# Patient Record
Sex: Male | Born: 1965
Health system: Southern US, Community
[De-identification: ages and names within clinical notes are randomized; demographics above are authoritative.]

## PROBLEM LIST (undated history)

## (undated) DIAGNOSIS — F32A Depression, unspecified: Secondary | ICD-10-CM

## (undated) DIAGNOSIS — D1803 Hemangioma of intra-abdominal structures: Secondary | ICD-10-CM

## (undated) DIAGNOSIS — M1990 Unspecified osteoarthritis, unspecified site: Secondary | ICD-10-CM

## (undated) DIAGNOSIS — E2749 Other adrenocortical insufficiency: Secondary | ICD-10-CM

## (undated) DIAGNOSIS — E230 Hypopituitarism: Secondary | ICD-10-CM

## (undated) DIAGNOSIS — R079 Chest pain, unspecified: Secondary | ICD-10-CM

## (undated) HISTORY — DX: Unspecified osteoarthritis, unspecified site: M19.90

## (undated) HISTORY — DX: Hypopituitarism: E23.0

## (undated) HISTORY — PX: VASECTOMY: SHX75

## (undated) HISTORY — DX: Chest pain, unspecified: R07.9

## (undated) HISTORY — PX: SHOULDER SURGERY: SHX246

## (undated) HISTORY — DX: Other adrenocortical insufficiency: E27.49

## (undated) HISTORY — DX: Depression, unspecified: F32.A

## (undated) HISTORY — DX: Hemangioma of intra-abdominal structures: D18.03

---

## 1989-03-30 HISTORY — PX: WRIST SURGERY: SHX841

## 2000-05-20 ENCOUNTER — Encounter: Payer: Self-pay | Admitting: Family Medicine

## 2000-05-20 ENCOUNTER — Ambulatory Visit (HOSPITAL_COMMUNITY): Admission: RE | Admit: 2000-05-20 | Discharge: 2000-05-20 | Payer: Self-pay | Admitting: Family Medicine

## 2000-08-10 ENCOUNTER — Encounter: Payer: Self-pay | Admitting: Family Medicine

## 2000-08-10 ENCOUNTER — Ambulatory Visit (HOSPITAL_COMMUNITY): Admission: RE | Admit: 2000-08-10 | Discharge: 2000-08-10 | Payer: Self-pay | Admitting: Family Medicine

## 2001-04-06 ENCOUNTER — Ambulatory Visit (HOSPITAL_COMMUNITY): Admission: RE | Admit: 2001-04-06 | Discharge: 2001-04-06 | Payer: Self-pay | Admitting: Family Medicine

## 2001-04-06 ENCOUNTER — Encounter: Payer: Self-pay | Admitting: Family Medicine

## 2012-12-19 ENCOUNTER — Other Ambulatory Visit: Payer: Self-pay | Admitting: Family Medicine

## 2012-12-19 DIAGNOSIS — R109 Unspecified abdominal pain: Secondary | ICD-10-CM

## 2012-12-20 ENCOUNTER — Ambulatory Visit
Admission: RE | Admit: 2012-12-20 | Discharge: 2012-12-20 | Disposition: A | Discharge status: Home / Self Care | Payer: BC Managed Care – PPO | Source: Ambulatory Visit | Attending: Family Medicine | Admitting: Family Medicine

## 2012-12-20 DIAGNOSIS — R109 Unspecified abdominal pain: Secondary | ICD-10-CM

## 2012-12-20 MED ORDER — IOHEXOL 300 MG/ML  SOLN
100.0000 mL | Freq: Once | INTRAMUSCULAR | Status: AC | PRN
  Administered 2012-12-20: 100 mL via INTRAVENOUS

## 2012-12-21 ENCOUNTER — Other Ambulatory Visit: Payer: Self-pay | Admitting: Family Medicine

## 2012-12-21 DIAGNOSIS — K769 Liver disease, unspecified: Secondary | ICD-10-CM

## 2012-12-28 ENCOUNTER — Ambulatory Visit
Admission: RE | Admit: 2012-12-28 | Discharge: 2012-12-28 | Disposition: A | Discharge status: Home / Self Care | Payer: BC Managed Care – PPO | Source: Ambulatory Visit | Attending: Family Medicine | Admitting: Family Medicine

## 2012-12-28 DIAGNOSIS — K769 Liver disease, unspecified: Secondary | ICD-10-CM

## 2012-12-28 MED ORDER — GADOXETATE DISODIUM 0.25 MMOL/ML IV SOLN
9.0000 mL | Freq: Once | INTRAVENOUS | Status: AC | PRN
  Administered 2012-12-28: 9 mL via INTRAVENOUS

## 2016-09-25 DIAGNOSIS — F900 Attention-deficit hyperactivity disorder, predominantly inattentive type: Secondary | ICD-10-CM | POA: Diagnosis not present

## 2017-10-15 DIAGNOSIS — M25851 Other specified joint disorders, right hip: Secondary | ICD-10-CM | POA: Diagnosis not present

## 2017-10-15 DIAGNOSIS — M25551 Pain in right hip: Secondary | ICD-10-CM | POA: Diagnosis not present

## 2017-10-20 DIAGNOSIS — M25551 Pain in right hip: Secondary | ICD-10-CM | POA: Diagnosis not present

## 2017-10-20 DIAGNOSIS — M25552 Pain in left hip: Secondary | ICD-10-CM | POA: Diagnosis not present

## 2018-03-30 HISTORY — PX: JOINT REPLACEMENT: SHX530

## 2018-04-07 DIAGNOSIS — M160 Bilateral primary osteoarthritis of hip: Secondary | ICD-10-CM | POA: Diagnosis not present

## 2018-04-07 DIAGNOSIS — M9903 Segmental and somatic dysfunction of lumbar region: Secondary | ICD-10-CM | POA: Diagnosis not present

## 2018-04-07 DIAGNOSIS — M9901 Segmental and somatic dysfunction of cervical region: Secondary | ICD-10-CM | POA: Diagnosis not present

## 2018-04-07 DIAGNOSIS — M9905 Segmental and somatic dysfunction of pelvic region: Secondary | ICD-10-CM | POA: Diagnosis not present

## 2018-04-15 ENCOUNTER — Other Ambulatory Visit: Payer: Self-pay | Admitting: Chiropractic Medicine

## 2018-04-15 DIAGNOSIS — M25551 Pain in right hip: Secondary | ICD-10-CM

## 2018-04-24 ENCOUNTER — Ambulatory Visit
Admission: RE | Admit: 2018-04-24 | Discharge: 2018-04-24 | Disposition: A | Discharge status: Home / Self Care | Payer: BLUE CROSS/BLUE SHIELD | Source: Ambulatory Visit | Attending: Chiropractic Medicine | Admitting: Chiropractic Medicine

## 2018-04-24 DIAGNOSIS — M1611 Unilateral primary osteoarthritis, right hip: Secondary | ICD-10-CM | POA: Diagnosis not present

## 2018-04-24 DIAGNOSIS — M25551 Pain in right hip: Secondary | ICD-10-CM

## 2018-04-24 MED ORDER — GADOBENATE DIMEGLUMINE 529 MG/ML IV SOLN
19.0000 mL | Freq: Once | INTRAVENOUS | Status: AC | PRN
  Administered 2018-04-24: 19 mL via INTRAVENOUS

## 2018-05-04 DIAGNOSIS — M160 Bilateral primary osteoarthritis of hip: Secondary | ICD-10-CM | POA: Diagnosis not present

## 2018-05-04 DIAGNOSIS — M9905 Segmental and somatic dysfunction of pelvic region: Secondary | ICD-10-CM | POA: Diagnosis not present

## 2018-05-04 DIAGNOSIS — M9903 Segmental and somatic dysfunction of lumbar region: Secondary | ICD-10-CM | POA: Diagnosis not present

## 2018-05-04 DIAGNOSIS — M9901 Segmental and somatic dysfunction of cervical region: Secondary | ICD-10-CM | POA: Diagnosis not present

## 2018-06-01 DIAGNOSIS — M9903 Segmental and somatic dysfunction of lumbar region: Secondary | ICD-10-CM | POA: Diagnosis not present

## 2018-06-01 DIAGNOSIS — M9901 Segmental and somatic dysfunction of cervical region: Secondary | ICD-10-CM | POA: Diagnosis not present

## 2018-06-01 DIAGNOSIS — M160 Bilateral primary osteoarthritis of hip: Secondary | ICD-10-CM | POA: Diagnosis not present

## 2018-06-01 DIAGNOSIS — M9905 Segmental and somatic dysfunction of pelvic region: Secondary | ICD-10-CM | POA: Diagnosis not present

## 2018-06-07 DIAGNOSIS — M1611 Unilateral primary osteoarthritis, right hip: Secondary | ICD-10-CM | POA: Diagnosis not present

## 2018-06-07 DIAGNOSIS — M25551 Pain in right hip: Secondary | ICD-10-CM | POA: Diagnosis not present

## 2018-07-14 DIAGNOSIS — M160 Bilateral primary osteoarthritis of hip: Secondary | ICD-10-CM | POA: Diagnosis not present

## 2018-08-19 DIAGNOSIS — Z01818 Encounter for other preprocedural examination: Secondary | ICD-10-CM | POA: Diagnosis not present

## 2018-08-19 DIAGNOSIS — F900 Attention-deficit hyperactivity disorder, predominantly inattentive type: Secondary | ICD-10-CM | POA: Diagnosis not present

## 2018-08-19 DIAGNOSIS — Z8342 Family history of familial hypercholesterolemia: Secondary | ICD-10-CM | POA: Diagnosis not present

## 2018-08-19 DIAGNOSIS — M1611 Unilateral primary osteoarthritis, right hip: Secondary | ICD-10-CM | POA: Diagnosis not present

## 2018-08-19 DIAGNOSIS — Z8249 Family history of ischemic heart disease and other diseases of the circulatory system: Secondary | ICD-10-CM | POA: Diagnosis not present

## 2018-08-19 DIAGNOSIS — Z131 Encounter for screening for diabetes mellitus: Secondary | ICD-10-CM | POA: Diagnosis not present

## 2018-08-19 DIAGNOSIS — Z125 Encounter for screening for malignant neoplasm of prostate: Secondary | ICD-10-CM | POA: Diagnosis not present

## 2018-08-19 DIAGNOSIS — Z13220 Encounter for screening for lipoid disorders: Secondary | ICD-10-CM | POA: Diagnosis not present

## 2018-08-31 DIAGNOSIS — M9903 Segmental and somatic dysfunction of lumbar region: Secondary | ICD-10-CM | POA: Diagnosis not present

## 2018-08-31 DIAGNOSIS — M9905 Segmental and somatic dysfunction of pelvic region: Secondary | ICD-10-CM | POA: Diagnosis not present

## 2018-08-31 DIAGNOSIS — M160 Bilateral primary osteoarthritis of hip: Secondary | ICD-10-CM | POA: Diagnosis not present

## 2018-08-31 DIAGNOSIS — M9901 Segmental and somatic dysfunction of cervical region: Secondary | ICD-10-CM | POA: Diagnosis not present

## 2018-09-28 DIAGNOSIS — Z0189 Encounter for other specified special examinations: Secondary | ICD-10-CM | POA: Diagnosis not present

## 2018-10-04 DIAGNOSIS — M1611 Unilateral primary osteoarthritis, right hip: Secondary | ICD-10-CM | POA: Diagnosis not present

## 2018-11-08 DIAGNOSIS — Z471 Aftercare following joint replacement surgery: Secondary | ICD-10-CM | POA: Diagnosis not present

## 2018-11-08 DIAGNOSIS — Z96641 Presence of right artificial hip joint: Secondary | ICD-10-CM | POA: Diagnosis not present

## 2018-11-08 DIAGNOSIS — M25552 Pain in left hip: Secondary | ICD-10-CM | POA: Diagnosis not present

## 2018-11-16 DIAGNOSIS — R05 Cough: Secondary | ICD-10-CM | POA: Diagnosis not present

## 2018-12-02 DIAGNOSIS — M1612 Unilateral primary osteoarthritis, left hip: Secondary | ICD-10-CM | POA: Diagnosis not present

## 2018-12-02 DIAGNOSIS — Z9889 Other specified postprocedural states: Secondary | ICD-10-CM | POA: Diagnosis not present

## 2018-12-02 DIAGNOSIS — Z4732 Aftercare following explantation of hip joint prosthesis: Secondary | ICD-10-CM | POA: Diagnosis not present

## 2018-12-02 DIAGNOSIS — Z96641 Presence of right artificial hip joint: Secondary | ICD-10-CM | POA: Diagnosis not present

## 2018-12-12 DIAGNOSIS — M1612 Unilateral primary osteoarthritis, left hip: Secondary | ICD-10-CM | POA: Diagnosis not present

## 2018-12-12 DIAGNOSIS — Z9889 Other specified postprocedural states: Secondary | ICD-10-CM | POA: Diagnosis not present

## 2018-12-12 DIAGNOSIS — Z4732 Aftercare following explantation of hip joint prosthesis: Secondary | ICD-10-CM | POA: Diagnosis not present

## 2018-12-12 DIAGNOSIS — Z96641 Presence of right artificial hip joint: Secondary | ICD-10-CM | POA: Diagnosis not present

## 2018-12-19 DIAGNOSIS — Z4732 Aftercare following explantation of hip joint prosthesis: Secondary | ICD-10-CM | POA: Diagnosis not present

## 2018-12-19 DIAGNOSIS — M1612 Unilateral primary osteoarthritis, left hip: Secondary | ICD-10-CM | POA: Diagnosis not present

## 2018-12-19 DIAGNOSIS — Z96641 Presence of right artificial hip joint: Secondary | ICD-10-CM | POA: Diagnosis not present

## 2018-12-19 DIAGNOSIS — Z9889 Other specified postprocedural states: Secondary | ICD-10-CM | POA: Diagnosis not present

## 2018-12-26 DIAGNOSIS — Z4732 Aftercare following explantation of hip joint prosthesis: Secondary | ICD-10-CM | POA: Diagnosis not present

## 2018-12-26 DIAGNOSIS — Z9889 Other specified postprocedural states: Secondary | ICD-10-CM | POA: Diagnosis not present

## 2018-12-26 DIAGNOSIS — M1612 Unilateral primary osteoarthritis, left hip: Secondary | ICD-10-CM | POA: Diagnosis not present

## 2018-12-26 DIAGNOSIS — Z96641 Presence of right artificial hip joint: Secondary | ICD-10-CM | POA: Diagnosis not present

## 2019-01-05 ENCOUNTER — Other Ambulatory Visit: Payer: Self-pay | Admitting: Chiropractic Medicine

## 2019-01-05 DIAGNOSIS — M25559 Pain in unspecified hip: Secondary | ICD-10-CM

## 2019-01-09 DIAGNOSIS — N529 Male erectile dysfunction, unspecified: Secondary | ICD-10-CM | POA: Diagnosis not present

## 2019-01-09 DIAGNOSIS — Z4732 Aftercare following explantation of hip joint prosthesis: Secondary | ICD-10-CM | POA: Diagnosis not present

## 2019-01-09 DIAGNOSIS — Z23 Encounter for immunization: Secondary | ICD-10-CM | POA: Diagnosis not present

## 2019-01-09 DIAGNOSIS — Z0000 Encounter for general adult medical examination without abnormal findings: Secondary | ICD-10-CM | POA: Diagnosis not present

## 2019-01-09 DIAGNOSIS — Z96641 Presence of right artificial hip joint: Secondary | ICD-10-CM | POA: Diagnosis not present

## 2019-01-09 DIAGNOSIS — M1612 Unilateral primary osteoarthritis, left hip: Secondary | ICD-10-CM | POA: Diagnosis not present

## 2019-01-09 DIAGNOSIS — Z9889 Other specified postprocedural states: Secondary | ICD-10-CM | POA: Diagnosis not present

## 2019-01-16 DIAGNOSIS — Z9889 Other specified postprocedural states: Secondary | ICD-10-CM | POA: Diagnosis not present

## 2019-01-16 DIAGNOSIS — Z4732 Aftercare following explantation of hip joint prosthesis: Secondary | ICD-10-CM | POA: Diagnosis not present

## 2019-01-16 DIAGNOSIS — M1612 Unilateral primary osteoarthritis, left hip: Secondary | ICD-10-CM | POA: Diagnosis not present

## 2019-01-16 DIAGNOSIS — Z96641 Presence of right artificial hip joint: Secondary | ICD-10-CM | POA: Diagnosis not present

## 2019-01-22 ENCOUNTER — Other Ambulatory Visit: Payer: Self-pay

## 2019-01-22 ENCOUNTER — Ambulatory Visit
Admission: RE | Admit: 2019-01-22 | Discharge: 2019-01-22 | Disposition: A | Discharge status: Home / Self Care | Payer: BLUE CROSS/BLUE SHIELD | Source: Ambulatory Visit | Attending: Chiropractic Medicine | Admitting: Chiropractic Medicine

## 2019-01-22 DIAGNOSIS — M1612 Unilateral primary osteoarthritis, left hip: Secondary | ICD-10-CM | POA: Diagnosis not present

## 2019-01-22 DIAGNOSIS — M25559 Pain in unspecified hip: Secondary | ICD-10-CM

## 2019-01-22 DIAGNOSIS — Z96641 Presence of right artificial hip joint: Secondary | ICD-10-CM | POA: Diagnosis not present

## 2019-01-22 DIAGNOSIS — M24152 Other articular cartilage disorders, left hip: Secondary | ICD-10-CM | POA: Diagnosis not present

## 2019-01-22 DIAGNOSIS — M24852 Other specific joint derangements of left hip, not elsewhere classified: Secondary | ICD-10-CM | POA: Diagnosis not present

## 2019-01-22 MED ORDER — GADOBENATE DIMEGLUMINE 529 MG/ML IV SOLN
19.0000 mL | Freq: Once | INTRAVENOUS | Status: AC | PRN
  Administered 2019-01-22: 15:00:00 19 mL via INTRAVENOUS

## 2019-01-23 DIAGNOSIS — Z9889 Other specified postprocedural states: Secondary | ICD-10-CM | POA: Diagnosis not present

## 2019-01-23 DIAGNOSIS — M1612 Unilateral primary osteoarthritis, left hip: Secondary | ICD-10-CM | POA: Diagnosis not present

## 2019-01-23 DIAGNOSIS — Z96641 Presence of right artificial hip joint: Secondary | ICD-10-CM | POA: Diagnosis not present

## 2019-01-23 DIAGNOSIS — Z4732 Aftercare following explantation of hip joint prosthesis: Secondary | ICD-10-CM | POA: Diagnosis not present

## 2019-01-25 DIAGNOSIS — M1612 Unilateral primary osteoarthritis, left hip: Secondary | ICD-10-CM | POA: Diagnosis not present

## 2019-01-25 DIAGNOSIS — M25552 Pain in left hip: Secondary | ICD-10-CM | POA: Diagnosis not present

## 2019-02-01 DIAGNOSIS — M1612 Unilateral primary osteoarthritis, left hip: Secondary | ICD-10-CM | POA: Diagnosis not present

## 2019-02-01 DIAGNOSIS — Z4732 Aftercare following explantation of hip joint prosthesis: Secondary | ICD-10-CM | POA: Diagnosis not present

## 2019-02-01 DIAGNOSIS — Z96641 Presence of right artificial hip joint: Secondary | ICD-10-CM | POA: Diagnosis not present

## 2019-02-01 DIAGNOSIS — Z9889 Other specified postprocedural states: Secondary | ICD-10-CM | POA: Diagnosis not present

## 2019-02-06 DIAGNOSIS — Z9889 Other specified postprocedural states: Secondary | ICD-10-CM | POA: Diagnosis not present

## 2019-02-06 DIAGNOSIS — M1612 Unilateral primary osteoarthritis, left hip: Secondary | ICD-10-CM | POA: Diagnosis not present

## 2019-02-06 DIAGNOSIS — Z4732 Aftercare following explantation of hip joint prosthesis: Secondary | ICD-10-CM | POA: Diagnosis not present

## 2019-02-06 DIAGNOSIS — Z96641 Presence of right artificial hip joint: Secondary | ICD-10-CM | POA: Diagnosis not present

## 2019-02-07 DIAGNOSIS — Z01818 Encounter for other preprocedural examination: Secondary | ICD-10-CM | POA: Diagnosis not present

## 2019-02-13 DIAGNOSIS — Z96641 Presence of right artificial hip joint: Secondary | ICD-10-CM | POA: Diagnosis not present

## 2019-02-13 DIAGNOSIS — Z9889 Other specified postprocedural states: Secondary | ICD-10-CM | POA: Diagnosis not present

## 2019-02-13 DIAGNOSIS — Z4732 Aftercare following explantation of hip joint prosthesis: Secondary | ICD-10-CM | POA: Diagnosis not present

## 2019-02-13 DIAGNOSIS — M1612 Unilateral primary osteoarthritis, left hip: Secondary | ICD-10-CM | POA: Diagnosis not present

## 2019-02-14 DIAGNOSIS — M1612 Unilateral primary osteoarthritis, left hip: Secondary | ICD-10-CM | POA: Diagnosis not present

## 2019-03-13 DIAGNOSIS — Z4732 Aftercare following explantation of hip joint prosthesis: Secondary | ICD-10-CM | POA: Diagnosis not present

## 2019-03-13 DIAGNOSIS — Z96643 Presence of artificial hip joint, bilateral: Secondary | ICD-10-CM | POA: Diagnosis not present

## 2019-03-13 DIAGNOSIS — M1612 Unilateral primary osteoarthritis, left hip: Secondary | ICD-10-CM | POA: Diagnosis not present

## 2019-03-13 DIAGNOSIS — Z9889 Other specified postprocedural states: Secondary | ICD-10-CM | POA: Diagnosis not present

## 2019-03-14 DIAGNOSIS — Z23 Encounter for immunization: Secondary | ICD-10-CM | POA: Diagnosis not present

## 2019-03-20 DIAGNOSIS — Z96643 Presence of artificial hip joint, bilateral: Secondary | ICD-10-CM | POA: Diagnosis not present

## 2019-03-20 DIAGNOSIS — M1612 Unilateral primary osteoarthritis, left hip: Secondary | ICD-10-CM | POA: Diagnosis not present

## 2019-03-20 DIAGNOSIS — Z4732 Aftercare following explantation of hip joint prosthesis: Secondary | ICD-10-CM | POA: Diagnosis not present

## 2019-03-20 DIAGNOSIS — Z9889 Other specified postprocedural states: Secondary | ICD-10-CM | POA: Diagnosis not present

## 2019-03-21 DIAGNOSIS — Z96642 Presence of left artificial hip joint: Secondary | ICD-10-CM | POA: Diagnosis not present

## 2019-03-30 DIAGNOSIS — M1612 Unilateral primary osteoarthritis, left hip: Secondary | ICD-10-CM | POA: Diagnosis not present

## 2019-03-30 DIAGNOSIS — Z9889 Other specified postprocedural states: Secondary | ICD-10-CM | POA: Diagnosis not present

## 2019-03-30 DIAGNOSIS — Z4732 Aftercare following explantation of hip joint prosthesis: Secondary | ICD-10-CM | POA: Diagnosis not present

## 2019-03-30 DIAGNOSIS — Z96643 Presence of artificial hip joint, bilateral: Secondary | ICD-10-CM | POA: Diagnosis not present

## 2019-10-11 DIAGNOSIS — B349 Viral infection, unspecified: Secondary | ICD-10-CM | POA: Diagnosis not present

## 2019-10-11 DIAGNOSIS — B029 Zoster without complications: Secondary | ICD-10-CM | POA: Diagnosis not present

## 2019-10-30 DIAGNOSIS — R519 Headache, unspecified: Secondary | ICD-10-CM | POA: Diagnosis not present

## 2019-10-30 DIAGNOSIS — W57XXXA Bitten or stung by nonvenomous insect and other nonvenomous arthropods, initial encounter: Secondary | ICD-10-CM | POA: Diagnosis not present

## 2019-10-30 DIAGNOSIS — R5383 Other fatigue: Secondary | ICD-10-CM | POA: Diagnosis not present

## 2019-10-30 DIAGNOSIS — M791 Myalgia, unspecified site: Secondary | ICD-10-CM | POA: Diagnosis not present

## 2019-11-08 DIAGNOSIS — R5383 Other fatigue: Secondary | ICD-10-CM | POA: Diagnosis not present

## 2019-11-08 DIAGNOSIS — Z03818 Encounter for observation for suspected exposure to other biological agents ruled out: Secondary | ICD-10-CM | POA: Diagnosis not present

## 2019-11-14 DIAGNOSIS — M791 Myalgia, unspecified site: Secondary | ICD-10-CM | POA: Diagnosis not present

## 2019-11-14 DIAGNOSIS — M2559 Pain in other specified joint: Secondary | ICD-10-CM | POA: Diagnosis not present

## 2019-11-14 DIAGNOSIS — R5383 Other fatigue: Secondary | ICD-10-CM | POA: Diagnosis not present

## 2019-11-24 DIAGNOSIS — M25551 Pain in right hip: Secondary | ICD-10-CM | POA: Diagnosis not present

## 2019-11-24 DIAGNOSIS — Z96643 Presence of artificial hip joint, bilateral: Secondary | ICD-10-CM | POA: Diagnosis not present

## 2019-11-24 DIAGNOSIS — Z471 Aftercare following joint replacement surgery: Secondary | ICD-10-CM | POA: Diagnosis not present

## 2019-11-24 DIAGNOSIS — Z96642 Presence of left artificial hip joint: Secondary | ICD-10-CM | POA: Diagnosis not present

## 2020-03-24 DIAGNOSIS — Z20828 Contact with and (suspected) exposure to other viral communicable diseases: Secondary | ICD-10-CM | POA: Diagnosis not present

## 2020-03-27 ENCOUNTER — Other Ambulatory Visit: Payer: Self-pay

## 2020-03-27 ENCOUNTER — Ambulatory Visit (INDEPENDENT_AMBULATORY_CARE_PROVIDER_SITE_OTHER): Payer: BC Managed Care – PPO | Admitting: Endocrinology

## 2020-03-27 ENCOUNTER — Encounter: Payer: Self-pay | Admitting: Endocrinology

## 2020-03-27 VITALS — BP 106/78 | HR 73 | Ht 70.0 in | Wt 178.4 lb

## 2020-03-27 DIAGNOSIS — E230 Hypopituitarism: Secondary | ICD-10-CM

## 2020-03-27 DIAGNOSIS — R5383 Other fatigue: Secondary | ICD-10-CM | POA: Diagnosis not present

## 2020-03-27 LAB — BASIC METABOLIC PANEL
BUN: 20 mg/dL (ref 6–23)
CO2: 30 mEq/L (ref 19–32)
Calcium: 10 mg/dL (ref 8.4–10.5)
Chloride: 101 mEq/L (ref 96–112)
Creatinine, Ser: 0.88 mg/dL (ref 0.40–1.50)
GFR: 97.27 mL/min (ref >60.00)
Glucose, Bld: 78 mg/dL (ref 70–99)
Potassium: 4.1 mEq/L (ref 3.5–5.1)
Sodium: 138 mEq/L (ref 135–145)

## 2020-03-27 LAB — LUTEINIZING HORMONE: LH: 3.94 m[IU]/mL (ref 1.50–9.30)

## 2020-03-27 LAB — CORTISOL
Cortisol, Plasma: 0 ug/dL
Cortisol, Plasma: 0 ug/dL

## 2020-03-27 LAB — T4, FREE: Free T4: 0.82 ng/dL (ref 0.60–1.60)

## 2020-03-27 NOTE — Progress Notes (Signed)
Patient ID: Brandon Andrews, male   DOB: Feb 12, 1966, 54 y.o.   MRN: [id number]           Referring physician: Shirline Frees  Chief complaint: Fatigue  History of Present Illness:  He started having multiple symptoms seen in June 2021 Initially was having some headaches which were intermittent Also had symptoms of feeling more tired and weak and feeling exhausted He also had some difficulty with thinking clearly or finding the right words Subsequently he also started losing weight when he was weak and probably lost about 25 pounds He was having hiccups but no symptoms of nausea or vomiting Was having some dizziness at times but not significant lightheadedness or syncope Also has had some loose stools Additionally he was having problems with pain in all his joints and felt like bone pain Is also complaining of some spasms in his extremities but not cramps  In August he had a mild increase in TSH of 5.05 and was started on 25 mcg of levothyroxine without any benefit In October because of significant joint pain he was seen by a rheumatologist Although his work-up was negative he was given prednisone 10 mg twice daily which he thinks he took for about 3 weeks While taking prednisone he had normalization of his energy level, improved mood, decreased pain and no weakness He was taken off prednisone in early December and since then his symptoms are slowly returning  His weight has leveled off  He has been referred here because his cortisol level done on 03/15/2020 was undetectable, he may have been off his prednisone about a week before  Other labs: Free T4 level 0.41, low, done on 11/14/2019 Electrolytes normal  History reviewed. No pertinent past medical history.  History reviewed. No pertinent surgical history.  Family History  Problem Relation Age of Onset  . Thyroid cancer Sister   . Diabetes Paternal Grandmother     Social History:  reports that he has never smoked. He has  never used smokeless tobacco. He reports that he does not drink alcohol and does not use drugs.  Allergies: No Known Allergies  Allergies as of 03/27/2020   No Known Allergies     Medication List       Accurate as of March 27, 2020  3:01 PM. If you have any questions, ask your nurse or doctor.        levothyroxine 25 MCG tablet Commonly known as: SYNTHROID Take 25 mcg by mouth daily before breakfast.       LABS:  No visits with results within 1 Week(s) from this visit.  Latest known visit with results is:  No results found for any previous visit.        Review of Systems  Constitutional: Positive for weight loss. Negative for reduced appetite.  HENT: Positive for headaches.   Eyes: Negative for blurred vision.  Respiratory: Negative for shortness of breath.   Cardiovascular: Negative for leg swelling.  Gastrointestinal: Negative for nausea.       Has tendency to loose stools  Endocrine: Positive for fatigue and cold intolerance. Negative for light-headedness.  Musculoskeletal: Positive for joint pain.  Skin: Negative for rash.  Neurological: Positive for weakness. Negative for numbness.       Feels like an electric sensation running up his extremities  Psychiatric/Behavioral: Positive for insomnia.     PHYSICAL EXAM:  BP 106/78 (Patient Position: Standing)   Pulse 73   Ht 5\' 10"  (1.778 m)   Wt 178  lb 6.4 oz (80.9 kg)   SpO2 97%   BMI 25.60 kg/m   GENERAL:   No pallor, clubbing, lymphadenopathy or edema.    Skin:  no rash or pigmentation.  EYES:  Externally normal.  Fundii:  normal discs and vessels. Visual fields are normal by confrontation  ENT: Oral mucosa and tongue normal.  No pigmentation of the oral mucosa present  THYROID:  Not palpable.  HEART:  Normal  S1 and S2; no murmur or click.  CHEST:  Normal shape.  Lungs: Vescicular breath sounds heard equally.  No crepitations/ wheeze.  ABDOMEN:  No distention.  Liver and spleen not  palpable.  No other mass or tenderness.  NEUROLOGICAL: Appears to have mild difficulty getting up from lying or sitting .Reflexes are bilaterally well at biceps.  JOINTS:  Normal peripheral joints.   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  Labs showed low free T4 level and undetectable cortisol level although this may have been affected by preceding prednisone use.  Although he has no recent headaches or any apparent visual changes he did have some headaches earlier this year which may be of concern regarding structural pituitary disease   PLAN:    Cortrosyn stimulation test to be done today  Repeat free T4  Check IGF-I, prolactin, BMP, LH and prolactin  We will also need fasting free testosterone level  Medications to be decided on lab results, likely will need to replace cortisol before giving him therapeutic doses of levothyroxine  Most likely will also need to order MRI of pituitary gland if hypopituitarism confirmed   Consultation note sent to the referring physician  Reather Littler 03/27/2020, 3:01 PM

## 2020-03-28 ENCOUNTER — Other Ambulatory Visit: Payer: BC Managed Care – PPO

## 2020-03-28 ENCOUNTER — Other Ambulatory Visit: Payer: Self-pay | Admitting: Endocrinology

## 2020-03-28 DIAGNOSIS — E230 Hypopituitarism: Secondary | ICD-10-CM | POA: Diagnosis not present

## 2020-03-28 DIAGNOSIS — E2740 Unspecified adrenocortical insufficiency: Secondary | ICD-10-CM | POA: Diagnosis not present

## 2020-03-28 DIAGNOSIS — E221 Hyperprolactinemia: Secondary | ICD-10-CM

## 2020-03-28 DIAGNOSIS — R7989 Other specified abnormal findings of blood chemistry: Secondary | ICD-10-CM

## 2020-03-28 LAB — PROLACTIN: Prolactin: 26.5 ng/mL — ABNORMAL HIGH (ref 4.0–15.2)

## 2020-03-28 LAB — INSULIN-LIKE GROWTH FACTOR: Insulin-Like GF-1: 126 ng/mL (ref 74–255)

## 2020-03-29 ENCOUNTER — Other Ambulatory Visit: Payer: Self-pay | Admitting: Endocrinology

## 2020-03-29 LAB — TESTOSTERONE, FREE, TOTAL, SHBG
Sex Hormone Binding: 90.7 nmol/L — ABNORMAL HIGH (ref 19.3–76.4)
Testosterone: 589 ng/dL (ref 264–916)

## 2020-03-29 MED ORDER — HYDROCORTISONE 20 MG PO TABS
ORAL_TABLET | ORAL | 0 refills | Status: DC
Start: 2020-03-29 — End: 2020-04-24

## 2020-03-30 LAB — TESTOSTERONE, FREE, TOTAL, SHBG: Testosterone, Free: 2.8 pg/mL — ABNORMAL LOW (ref 7.2–24.0)

## 2020-03-30 LAB — ACTH: ACTH: <1.5 pg/mL — ABNORMAL LOW (ref 7.2–63.3)

## 2020-04-02 LAB — SYNTHETIC GLUCOCORTICOID SRN.U

## 2020-04-10 ENCOUNTER — Other Ambulatory Visit: Payer: Self-pay

## 2020-04-10 ENCOUNTER — Ambulatory Visit
Admission: RE | Admit: 2020-04-10 | Discharge: 2020-04-10 | Disposition: A | Discharge status: Home / Self Care | Payer: BC Managed Care – PPO | Source: Ambulatory Visit | Attending: Endocrinology | Admitting: Endocrinology

## 2020-04-10 DIAGNOSIS — E230 Hypopituitarism: Secondary | ICD-10-CM

## 2020-04-10 DIAGNOSIS — E221 Hyperprolactinemia: Secondary | ICD-10-CM

## 2020-04-10 LAB — SYNTHETIC GLUCOCORTICOID SRN.U

## 2020-04-10 MED ORDER — GADOBENATE DIMEGLUMINE 529 MG/ML IV SOLN
10.0000 mL | Freq: Once | INTRAVENOUS | Status: AC | PRN
  Administered 2020-04-10: 10 mL via INTRAVENOUS

## 2020-04-12 ENCOUNTER — Other Ambulatory Visit: Payer: BC Managed Care – PPO

## 2020-04-12 ENCOUNTER — Ambulatory Visit: Payer: BC Managed Care – PPO | Admitting: Endocrinology

## 2020-04-12 ENCOUNTER — Other Ambulatory Visit (INDEPENDENT_AMBULATORY_CARE_PROVIDER_SITE_OTHER): Payer: BC Managed Care – PPO

## 2020-04-12 ENCOUNTER — Other Ambulatory Visit: Payer: Self-pay

## 2020-04-12 DIAGNOSIS — E230 Hypopituitarism: Secondary | ICD-10-CM

## 2020-04-12 LAB — T4, FREE: Free T4: 0.78 ng/dL (ref 0.60–1.60)

## 2020-04-12 LAB — BASIC METABOLIC PANEL
BUN: 22 mg/dL (ref 6–23)
CO2: 29 mEq/L (ref 19–32)
Calcium: 9.4 mg/dL (ref 8.4–10.5)
Chloride: 104 mEq/L (ref 96–112)
Creatinine, Ser: 0.83 mg/dL (ref 0.40–1.50)
GFR: 98.97 mL/min (ref >60.00)
Glucose, Bld: 82 mg/dL (ref 70–99)
Potassium: 4.5 mEq/L (ref 3.5–5.1)
Sodium: 139 mEq/L (ref 135–145)

## 2020-04-15 ENCOUNTER — Encounter: Payer: Self-pay | Admitting: Endocrinology

## 2020-04-15 ENCOUNTER — Ambulatory Visit: Payer: BC Managed Care – PPO | Admitting: Endocrinology

## 2020-04-15 ENCOUNTER — Other Ambulatory Visit: Payer: Self-pay

## 2020-04-15 ENCOUNTER — Telehealth (INDEPENDENT_AMBULATORY_CARE_PROVIDER_SITE_OTHER): Payer: BC Managed Care – PPO | Admitting: Endocrinology

## 2020-04-15 VITALS — Ht 71.0 in | Wt 172.0 lb

## 2020-04-15 DIAGNOSIS — E230 Hypopituitarism: Secondary | ICD-10-CM | POA: Insufficient documentation

## 2020-04-15 DIAGNOSIS — E221 Hyperprolactinemia: Secondary | ICD-10-CM | POA: Diagnosis not present

## 2020-04-15 MED ORDER — ANDROGEL PUMP 20.25 MG/ACT (1.62%) TD GEL: TRANSDERMAL | 1 refills | Status: DC

## 2020-04-15 NOTE — Progress Notes (Deleted)
  Fasting readings with dates: __ __ ___ ___  __ __   After breakfast or before lunch:__ __ ___ ___  __ __   After lunch/before dinner:__ __ ___ ___  __ __   After dinner/bedtime__ __ ___ ___  __ __   Average for 15 or 30 days:

## 2020-04-15 NOTE — Progress Notes (Signed)
Patient ID: Brandon Andrews, male   DOB: 1966/03/04, 55 y.o.   MRN: [id number]           Today's office visit was provided via telemedicine using a telephone call to the patient Patient has been explained the limitations of evaluation and management by telemedicine and the availability of in person appointments.  The patient understood the limitations and agreed to proceed. Patient also understood that the telehealth visit is billable. . Location of the patient: Home . Location of the provider: Office Only the patient and myself were participating in the encounter   Chief complaint: Endocrinology follow-up  History of Present Illness:  History obtained at initial visit on 03/27/2020  He started having multiple symptoms seen in June 2021 Initially was having some headaches which were intermittent Also had symptoms of feeling more tired and weak and feeling exhausted He also had some difficulty with thinking clearly or finding the right words Subsequently he also started losing weight when he was weak and probably lost about 25 pounds He was having hiccups but no symptoms of nausea or vomiting Was having some dizziness at times but not significant lightheadedness or syncope. Also has had some loose stools Additionally he was having problems with pain in all his joints and felt like bone pain Is also complaining of some spasms in his extremities but not cramps In October 21 because of significant joint pain he was seen by a rheumatologist Although his work-up was negative he was given prednisone 10 mg twice Brandon which he thinks he took for about 3 weeks While taking prednisone he had normalization of his energy level, improved mood, decreased pain and no weakness He was taken off prednisone in early December  RECENT HISTORY:  He has had HYPOPITUITARISM as follows   Symptoms of initial weakness, weight loss and decreased appetite as well as blood pressure noted to be low with PCP.   Subsequently labs indicated undetectable cortisol levels even after ACTH stimulation ACTH level also low Synthetic glucocorticoid screen was negative  He was started on hydrocortisone 20 mg in the morning and 10 mg in the late afternoon on 03/29/2020 With this he has had significant improvement on his symptoms of joint pains, fatigue although he still has some intermittent symptoms.  He is resting better at night, previously at times would not be able to sleep because of pain. Blood pressure reported at home was 130/67 standing Recent sodium is again normal   TESTOSTERONE deficiency: Although he has had testosterone deficiency for 10 to 15 years and previously treated with AndroGel he only recently has had symptoms of fatigue since late 2021.  Most of his testosterone levels had been over 300 through PCP office However his free testosterone level is significantly low level with normal LH level Prolactin level is mildly increased at 26  Lab Results  Component Value Date   TESTOFREE 2.8 (L) 03/28/2020      HYPOTHYROIDISM: Free T4 level 0.41, low, done on 11/14/2019 In August 2021 he had a mild increase in TSH of 5.05 and was started on 25 mcg of levothyroxine without any benefit Subsequently free T4 has been normal, he was advised to stop levothyroxine but he is still taking it  Lab Results  Component Value Date   FREET4 0.78 04/12/2020   FREET4 0.82 03/27/2020     IGF-I normal in 12/21   MRI of pituitary gland on 04/10/2020 was normal  Past Medical History:  Diagnosis Date  . Arthritis   .  Depression   . Hemangioma of liver     Past Surgical History:  Procedure Laterality Date  . JOINT REPLACEMENT     Bilateral  . VASECTOMY      Family History  Problem Relation Age of Onset  . Thyroid cancer Sister   . Diabetes Paternal Grandmother     Social History:  reports that he has never smoked. He has never used smokeless tobacco. He reports that he does not drink alcohol  and does not use drugs.  Allergies: No Known Allergies  Allergies as of 04/15/2020   No Known Allergies     Medication List       Accurate as of April 15, 2020 10:22 AM. If you have any questions, ask your nurse or doctor.        STOP taking these medications   aspirin 325 MG tablet Stopped by: Elayne Snare, MD   naproxen 500 MG tablet Commonly known as: NAPROSYN Stopped by: Elayne Snare, MD     TAKE these medications   buPROPion 300 MG 24 hr tablet Commonly known as: WELLBUTRIN XL Take 300 mg by mouth every morning.   hydrocortisone 20 MG tablet Commonly known as: CORTEF 1 tablet in the morning and half tablet at 5 PM   levothyroxine 25 MCG tablet Commonly known as: SYNTHROID Take 25 mcg by mouth Brandon before breakfast.       LABS:  Lab on 04/12/2020  Component Date Value Ref Range Status  . Sodium 04/12/2020 139  135 - 145 mEq/L Final  . Potassium 04/12/2020 4.5  3.5 - 5.1 mEq/L Final  . Chloride 04/12/2020 104  96 - 112 mEq/L Final  . CO2 04/12/2020 29  19 - 32 mEq/L Final  . Glucose, Bld 04/12/2020 82  70 - 99 mg/dL Final  . BUN 04/12/2020 22  6 - 23 mg/dL Final  . Creatinine, Ser 04/12/2020 0.83  0.40 - 1.50 mg/dL Final  . GFR 04/12/2020 98.97  >60.00 mL/min Final   Calculated using the CKD-EPI Creatinine Equation (2021)  . Calcium 04/12/2020 9.4  8.4 - 10.5 mg/dL Final  . Free T4 04/12/2020 0.78  0.60 - 1.60 ng/dL Final   Comment: Specimens from patients who are undergoing biotin therapy and /or ingesting biotin supplements may contain high levels of biotin.  The higher biotin concentration in these specimens interferes with this Free T4 assay.  Specimens that contain high levels  of biotin may cause false high results for this Free T4 assay.  Please interpret results in light of the total clinical presentation of the patient.          Review of Systems  Psychiatric/Behavioral: Negative for insomnia.   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  Currently has had persistent evidence of hypogonadism and very low cortisol levels.  Supplementation with therapeutic doses of hydrocortisone has shown a significant improvement in his symptoms over the last 2 weeks or so although still not consistently back to normal.  Discussed adrenal insufficiency, timing of hydrocortisone doses, therapeutic doses and stress doses if having any  acute illness  He has hypogonadism which is only evident on checking free testosterone rather than total testosterone levels with high SHBG level  MRI shows no structural pituitary disease  Hyperprolactinemia: Etiology of this is unclear and not clear if this is still related to prior inflammatory reaction in pituitary   PLAN:    Continue hydrocortisone unchanged  Stop levothyroxine for now, consider starting again if free T4 is low normal or below normal  AndroGel 1 pump on each arm.  He prefers brand-name and this was sent.  May need prior authorization.  Currently [employer] not covered  Will recheck prolactin on next visit  Follow-up in 6 weeks    Lorette Peterkin 04/15/2020, 10:22 AM

## 2020-04-16 ENCOUNTER — Telehealth: Payer: Self-pay | Admitting: *Deleted

## 2020-04-16 NOTE — Telephone Encounter (Signed)
Please note he wanted brand-name AndroGel

## 2020-04-17 NOTE — Telephone Encounter (Signed)
Noted  

## 2020-04-22 ENCOUNTER — Other Ambulatory Visit: Payer: BC Managed Care – PPO

## 2020-04-24 ENCOUNTER — Other Ambulatory Visit: Payer: Self-pay | Admitting: Endocrinology

## 2020-04-24 MED ORDER — HYDROCORTISONE 20 MG PO TABS: ORAL_TABLET | ORAL | 1 refills | Status: DC

## 2020-05-13 ENCOUNTER — Other Ambulatory Visit: Payer: BC Managed Care – PPO

## 2020-05-14 ENCOUNTER — Other Ambulatory Visit: Payer: Self-pay

## 2020-05-14 ENCOUNTER — Other Ambulatory Visit: Payer: Self-pay | Admitting: Endocrinology

## 2020-05-14 ENCOUNTER — Other Ambulatory Visit (INDEPENDENT_AMBULATORY_CARE_PROVIDER_SITE_OTHER): Payer: BC Managed Care – PPO

## 2020-05-14 DIAGNOSIS — E230 Hypopituitarism: Secondary | ICD-10-CM

## 2020-05-14 DIAGNOSIS — E2740 Unspecified adrenocortical insufficiency: Secondary | ICD-10-CM | POA: Diagnosis not present

## 2020-05-14 DIAGNOSIS — E221 Hyperprolactinemia: Secondary | ICD-10-CM

## 2020-05-14 DIAGNOSIS — R7989 Other specified abnormal findings of blood chemistry: Secondary | ICD-10-CM

## 2020-05-14 LAB — BASIC METABOLIC PANEL
BUN: 14 mg/dL (ref 6–23)
CO2: 29 mEq/L (ref 19–32)
Calcium: 9.4 mg/dL (ref 8.4–10.5)
Chloride: 107 mEq/L (ref 96–112)
Creatinine, Ser: 0.87 mg/dL (ref 0.40–1.50)
GFR: 97.52 mL/min (ref >60.00)
Glucose, Bld: 72 mg/dL (ref 70–99)
Potassium: 4.1 mEq/L (ref 3.5–5.1)
Sodium: 142 mEq/L (ref 135–145)

## 2020-05-14 LAB — T4, FREE: Free T4: 0.79 ng/dL (ref 0.60–1.60)

## 2020-05-14 LAB — TSH: TSH: 3.31 u[IU]/mL (ref 0.35–4.50)

## 2020-05-15 LAB — TESTOSTERONE, FREE, TOTAL, SHBG
Sex Hormone Binding: 33.9 nmol/L (ref 19.3–76.4)
Testosterone, Free: 4.5 pg/mL — ABNORMAL LOW (ref 7.2–24.0)
Testosterone: 395 ng/dL (ref 264–916)

## 2020-05-15 LAB — PROLACTIN: Prolactin: 10.6 ng/mL (ref 4.0–15.2)

## 2020-05-16 ENCOUNTER — Other Ambulatory Visit: Payer: Self-pay

## 2020-05-16 ENCOUNTER — Ambulatory Visit (INDEPENDENT_AMBULATORY_CARE_PROVIDER_SITE_OTHER): Payer: BC Managed Care – PPO | Admitting: Endocrinology

## 2020-05-16 VITALS — BP 122/74 | HR 55 | Ht 70.98 in | Wt 188.2 lb

## 2020-05-16 DIAGNOSIS — M2550 Pain in unspecified joint: Secondary | ICD-10-CM

## 2020-05-16 DIAGNOSIS — E230 Hypopituitarism: Secondary | ICD-10-CM

## 2020-05-16 NOTE — Progress Notes (Signed)
Patient ID: Brandon Andrews, male   DOB: [birth year], 55 y.o.   MRN: [id number]            Chief complaint: Endocrinology follow-up  History of Present Illness:  History obtained at initial visit on 03/27/2020  He started having multiple symptoms seen in June 2021 Initially was having some headaches which were intermittent Also had symptoms of feeling more tired and weak and feeling exhausted He also had some difficulty with thinking clearly or finding the right words Subsequently he also started losing weight when he was weak and probably lost about 25 pounds He was having hiccups but no symptoms of nausea or vomiting Was having some dizziness at times but not significant lightheadedness or syncope. Also has had some loose stools Additionally he was having problems with pain in all his joints and felt like bone pain Is also complaining of some spasms in his extremities but not cramps In October 21 because of significant joint pain he was seen by a rheumatologist Although his work-up was negative he was given prednisone 10 mg twice daily which he thinks he took for about 3 weeks While taking prednisone he had normalization of his energy level, improved mood, decreased pain and no weakness He was taken off prednisone in early December  RECENT HISTORY:  He has had HYPOPITUITARISM as follows   Symptoms of initial weakness, weight loss and decreased appetite as well as blood pressure noted to be low with PCP.  Subsequently labs indicated undetectable cortisol levels even after ACTH stimulation ACTH level also low Synthetic glucocorticoid screen was negative  He was started on hydrocortisone 20 mg in the morning and 10 mg in the late afternoon on 03/29/2020 With this he has had significant improvement on his symptoms of joint pains and weakness/fatigue  He thinks his energy level is about 75% back to normal No decreased appetite and has started getting back weight although his weight  before he had onset of symptoms was about 215 He has occasional and intermittent joint pains No lightheadedness Sodium level is normal   TESTOSTERONE deficiency: Although he has had testosterone deficiency for 10 to 15 years and previously treated with AndroGel he only started with symptoms of fatigue since late 2021.  Most of his testosterone levels had been over 300 through PCP office  Since his free testosterone level is significantly low with normal LH level he has been started on AndroGel He is getting a generic AndroGel He was prescribed 1 pump on each shoulder but he is doing really 1 pump daily He does not think there is any change in his energy level recently and has not had any issues with libido Free testosterone level is improved but still low  Prolactin level which was mildly increased at 26 is back to normal  Lab Results  Component Value Date   TESTOFREE 4.5 (L) 05/14/2020   TESTOFREE 2.8 (L) 03/28/2020      HYPOTHYROIDISM: Free T4 level 0.41, low, done on 11/14/2019 In August 2021 he had a mild increase in TSH of 5.05 and was started on 25 mcg of levothyroxine without any benefit Subsequently free T4 has been normal, he was advised to stop levothyroxine Free T4 is about the same  Lab Results  Component Value Date   TSH 3.31 05/14/2020   FREET4 0.79 05/14/2020   FREET4 0.78 04/12/2020   FREET4 0.82 03/27/2020     IGF-I normal in 12/21   MRI of pituitary gland on 04/10/2020 was normal  Past Medical History:  Diagnosis Date  . Arthritis   . Depression   . Hemangioma of liver     Past Surgical History:  Procedure Laterality Date  . JOINT REPLACEMENT     Bilateral  . VASECTOMY      Family History  Problem Relation Age of Onset  . Thyroid cancer Sister   . Diabetes Paternal Grandmother     Social History:  reports that he has never smoked. He has never used smokeless tobacco. He reports that he does not drink alcohol and does not use  drugs.  Allergies: No Known Allergies  Allergies as of 05/16/2020   No Known Allergies     Medication List       Accurate as of May 16, 2020  8:59 AM. If you have any questions, ask your nurse or doctor.        AndroGel Pump 20.25 MG/ACT (1.62%) Gel Generic drug: Testosterone Apply 1 pump on each shoulder daily   buPROPion 300 MG 24 hr tablet Commonly known as: WELLBUTRIN XL Take 300 mg by mouth every morning.   hydrocortisone 20 MG tablet Commonly known as: CORTEF 1 tablet in the morning and half tablet at 5 PM       LABS:  Lab on 05/14/2020  Component Date Value Ref Range Status  . Prolactin 05/14/2020 10.6  4.0 - 15.2 ng/mL Final  . TSH 05/14/2020 3.31  0.35 - 4.50 uIU/mL Final  . Free T4 05/14/2020 0.79  0.60 - 1.60 ng/dL Final   Comment: Specimens from patients who are undergoing biotin therapy and /or ingesting biotin supplements may contain high levels of biotin.  The higher biotin concentration in these specimens interferes with this Free T4 assay.  Specimens that contain high levels  of biotin may cause false high results for this Free T4 assay.  Please interpret results in light of the total clinical presentation of the patient.    . Testosterone 05/14/2020 395  264 - 916 ng/dL Final   Comment: Adult male reference interval is based on a population of healthy nonobese males (BMI <30) between 72 and 12 years old. [place], [place] [phone]. PMID: 09326712.   Marland Kitchen Testosterone, Free 05/14/2020 4.5* 7.2 - 24.0 pg/mL Final  . Sex Hormone Binding 05/14/2020 33.9  19.3 - 76.4 nmol/L Final  . Sodium 05/14/2020 142  135 - 145 mEq/L Final  . Potassium 05/14/2020 4.1  3.5 - 5.1 mEq/L Final  . Chloride 05/14/2020 107  96 - 112 mEq/L Final  . CO2 05/14/2020 29  19 - 32 mEq/L Final  . Glucose, Bld 05/14/2020 72  70 - 99 mg/dL Final  . BUN 05/14/2020 14  6 - 23 mg/dL Final  . Creatinine, Ser 05/14/2020 0.87  0.40 - 1.50 mg/dL Final  . GFR 05/14/2020  97.52  >60.00 mL/min Final   Calculated using the CKD-EPI Creatinine Equation (2021)  . Calcium 05/14/2020 9.4  8.4 - 10.5 mg/dL Final        Review of Systems  Psychiatric/Behavioral: Negative for insomnia.   Wt Readings from Last 3 Encounters:  05/16/20 188 lb 3.2 oz (85.4 kg)  04/15/20 172 lb (78 kg)  03/27/20 178 lb 6.4 oz (80.9 kg)     PHYSICAL EXAM:  BP 120/70 (BP Location: Left Arm, Patient Position: Sitting)   Pulse (!) 55   Ht 5' 10.98" (1.803 m)   Wt 188 lb 3.2 oz (85.4 kg)   SpO2 99%   BMI 26.26 kg/m  Standing blood pressure 122/74  ASSESSMENT:    Partial hypopituitarism related to possible hypophysitis in August 2021  Subsequently had persistent evidence of hypogonadism and very low cortisol levels.  Supplementation with full therapeutic doses of hydrocortisone has improved his fatigue and joint symptoms  Discussed diagnosis, medications, management and any questions he had regarding cortisone supplementation, monitoring treatment and reassured him about long-term safety if needed  Also may consider 24-hour urine free cortisol with dexamethasone in about 6 months  He has hypogonadism based on free testosterone levels and currently levels are not adequate with 1 pump hot nodule daily  MRI shows no structural pituitary disease  Hyperprolactinemia: Resolved, was only mildly increased  Weight gain: This is likely to be from continued replacement of his cortisol deficiency and his baseline weight was about 215 This may improve her level labs with starting exercise regimen although he does not think it makes a difference  PLAN:    Continue hydrocortisone 20 mg in the morning and 10 mg in the late afternoon  No need for levothyroxine  AndroGel 1 pump on each arm instead of 1 pump daily  Follow-up in 3 months  Total visit time for evaluation and management, review of records, patient communication and labs and counseling =30 minutes  Elayne Snare 05/16/2020, 8:59 AM

## 2020-05-23 ENCOUNTER — Other Ambulatory Visit: Payer: Self-pay | Admitting: Endocrinology

## 2020-06-03 IMAGING — MR MR HIP*R* WO/W CM
8 of 9 series · 36 of 40 positions shown · IV contrast (multihance)
Comparison: CT scan of the abdomen and pelvis dated 12/20/2012

CLINICAL DATA: Chronic hip pain.

EXAM:
MRI OF THE RIGHT HIP WITHOUT AND WITH CONTRAST
TECHNIQUE: Multiplanar, multisequence MR imaging was performed both before and
after administration of intravenous contrast.
CONTRAST:  19mL MULTIHANCE GADOBENATE DIMEGLUMINE 529 MG/ML IV SOLN

[Series 4: T1 · coronal · 4.0mm · 0.74mm/px · 3 of 18 slices shown]
[im 1/18]
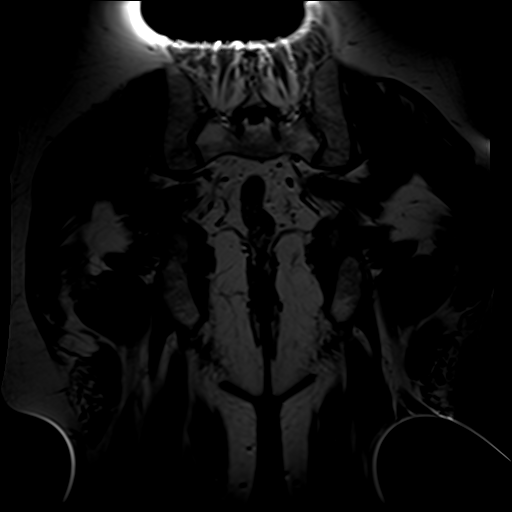
[im 9/18]
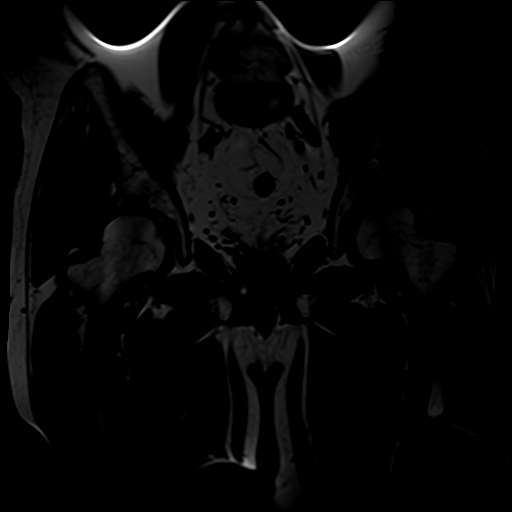
[im 18/18]
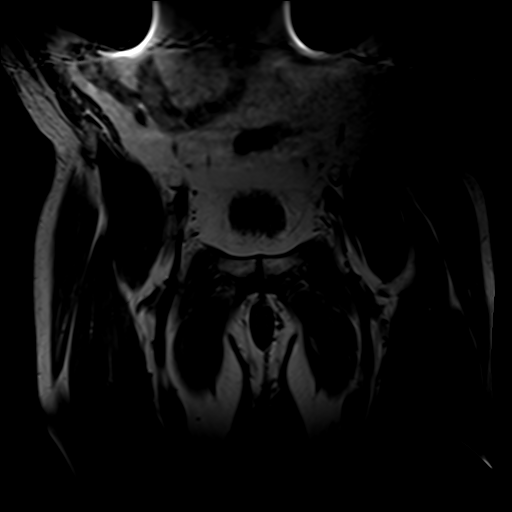

[Series 5: T2 fat-sat · coronal · 4.0mm · 0.74mm/px · 5 of 24 slices shown (1 of 2)]
[im 1/24]
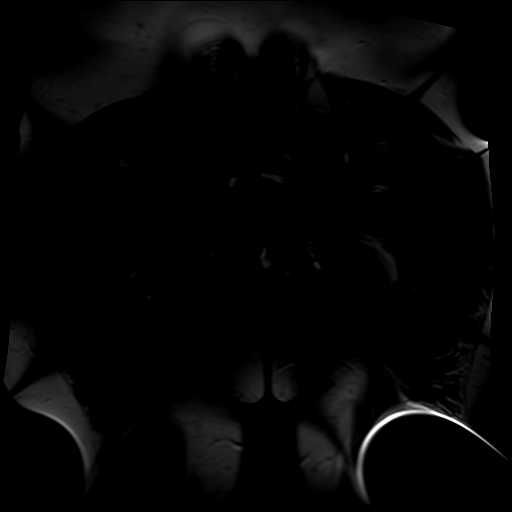
[im 6/24]
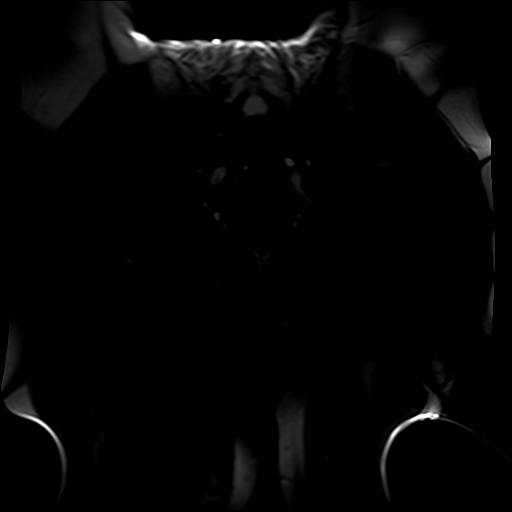
[im 12/24]
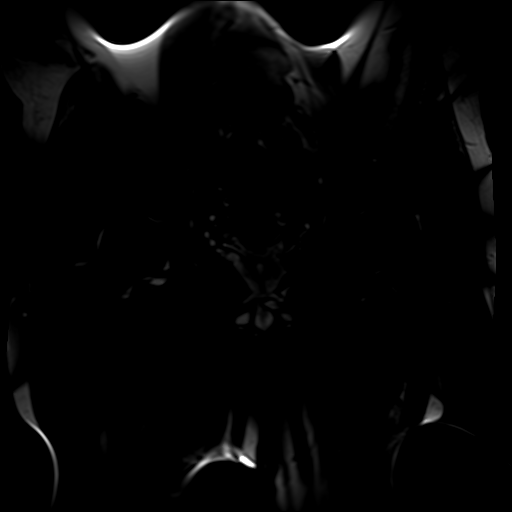
[im 18/24]
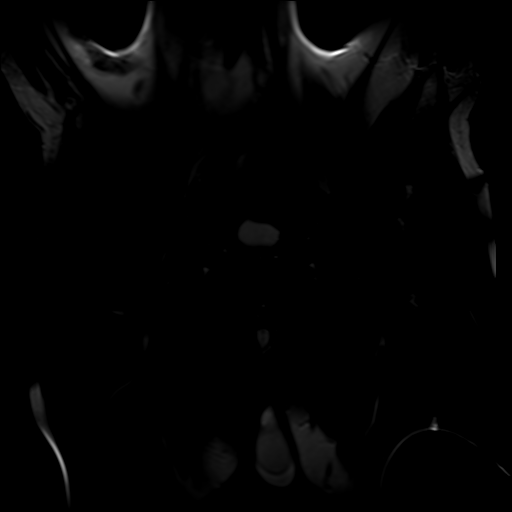
[im 24/24]
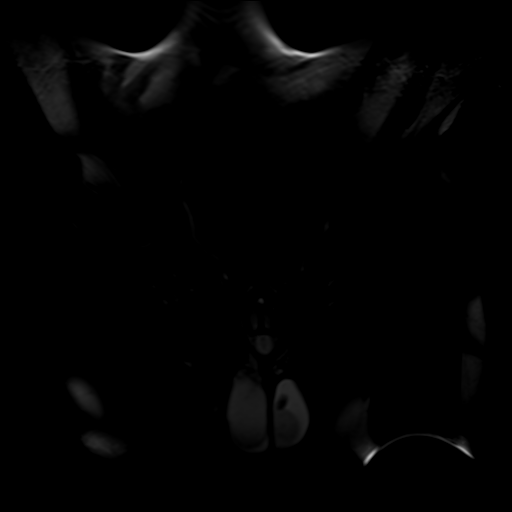

[Series 6: PD fat-sat · sagittal · 4.0mm · 0.70mm/px · 5 of 22 slices shown (1 of 3)]
[im 1/22]
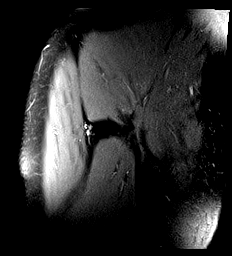
[im 6/22]
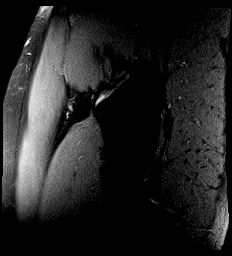
[im 11/22]
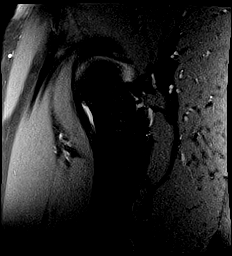
[im 16/22]
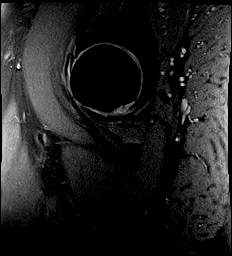
[im 22/22]
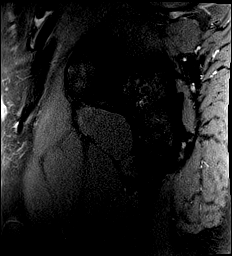

[Series 7: PD fat-sat · coronal · 4.0mm · 0.70mm/px · 4 of 19 slices shown (2 of 3)]
[im 1/19]
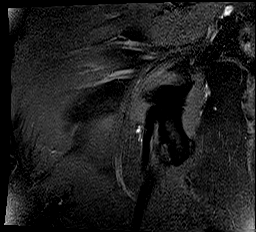
[im 7/19]
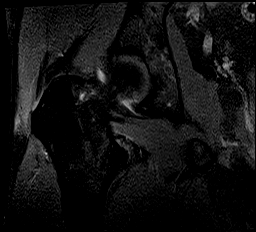
[im 13/19]
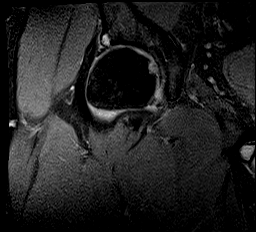
[im 19/19]
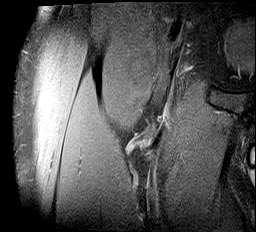

[Series 8: T2 fat-sat · axial · 4.0mm · 0.35mm/px · z∈[-73,+42]mm · 5 of 24 slices shown (2 of 2)]
[im 1/24]
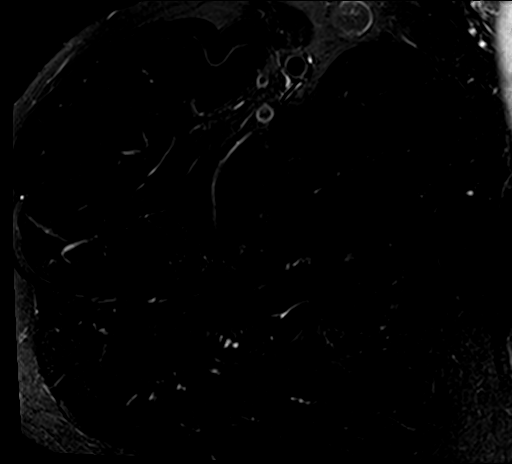
[im 6/24]
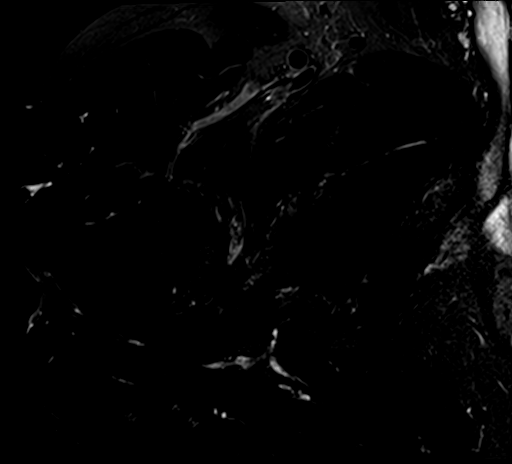
[im 12/24]
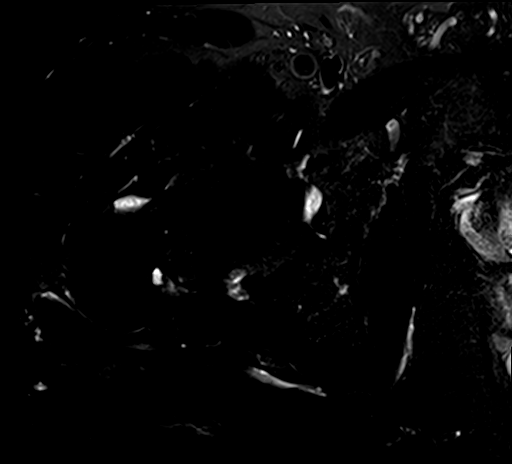
[im 18/24]
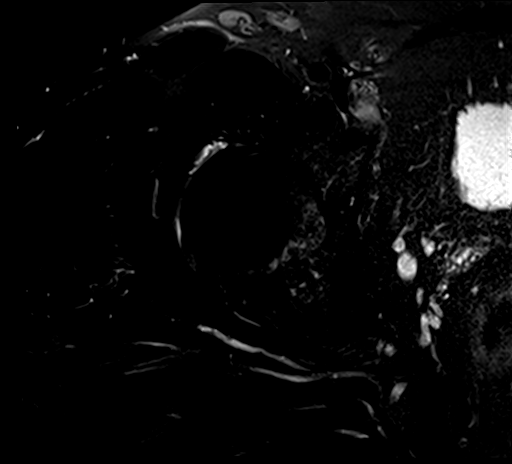
[im 24/24]
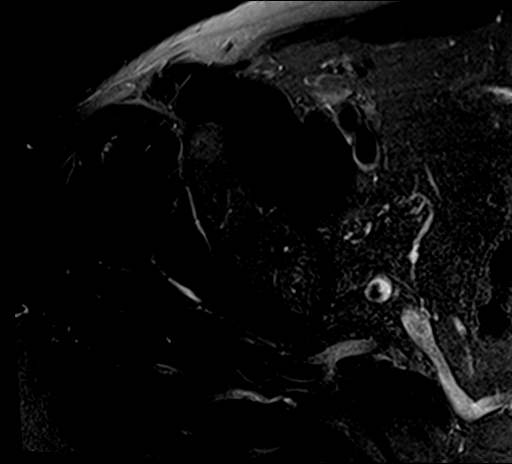

[Series 9: PD fat-sat · oblique · 4.0mm · 0.70mm/px · 4 of 20 slices shown (3 of 3)]
[im 1/20]
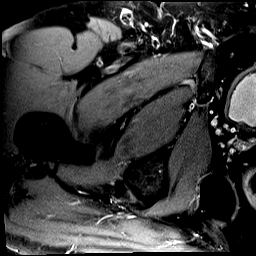
[im 7/20]
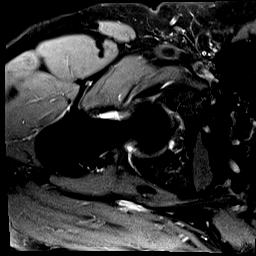
[im 13/20]
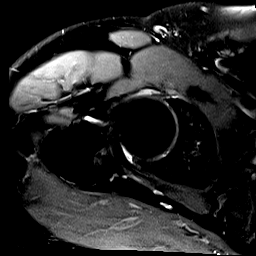
[im 20/20]
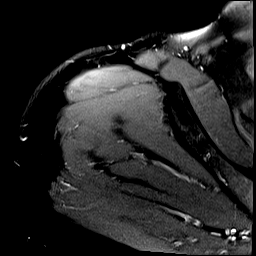

[Series 10: T1 fat-sat · axial · 4.0mm · 0.70mm/px · z∈[-73,+42]mm · 5 of 24 slices shown (1 of 2)]
[im 1/24]
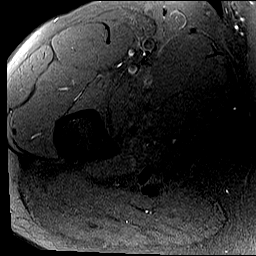
[im 6/24]
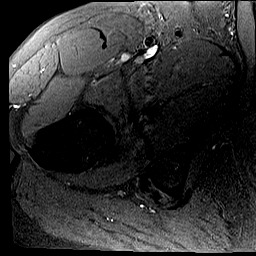
[im 12/24]
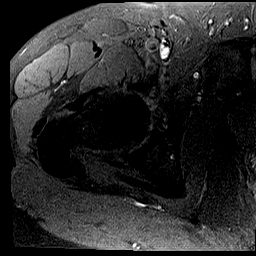
[im 18/24]
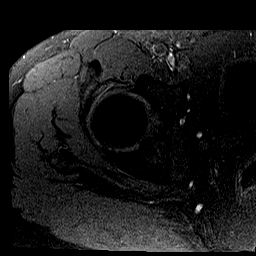
[im 24/24]
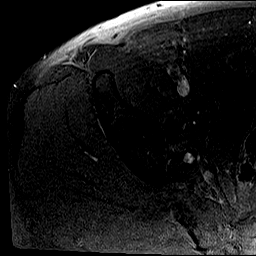

[Series 11: T1 fat-sat · axial · 4.0mm · 0.70mm/px · z∈[-73,+42]mm · 5 of 24 slices shown (2 of 2)]
[im 1/24]
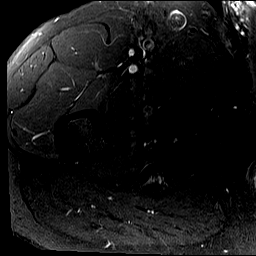
[im 6/24]
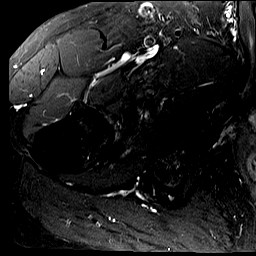
[im 12/24]
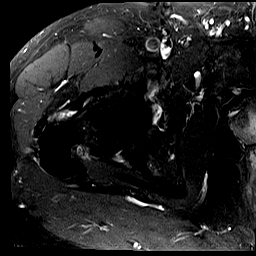
[im 18/24]
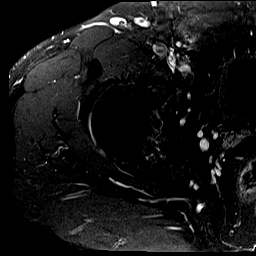
[im 24/24]
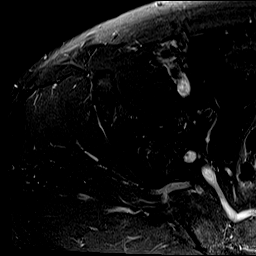

[36 of 40 positions shown; findings below may reference images not displayed]

FINDINGS: Bones: There are small cystic degenerative changes at the
superolateral aspect of the right acetabulum. The bones otherwise
appear normal.

Articular cartilage and labrum

Articular cartilage: There is thinning of the anterior superior
aspect of the articular cartilage of the right hip joint.

Labrum:  Slight intrinsic degeneration without a tear.

Joint or bursal effusion

Joint effusion: Small right hip effusion. Slight synovial
enhancement after contrast administration.

Bursae: No bursitis.

Muscles and tendons

Muscles and tendons: Slight enhancement around the origin of the
hamstring tendons at the right ischial tuberosity. Otherwise normal.

Other findings

Miscellaneous:   No adenopathy or mass lesions.
IMPRESSION: 1. Slight arthritic changes of the right hip with thinning of the
articular cartilage and subcortical cyst formation in the
superolateral aspect of the right acetabulum.
2. Small nonspecific right hip effusion.
3. Slight degenerative changes of the origins of the right hamstring
tendons.

## 2020-06-27 ENCOUNTER — Other Ambulatory Visit: Payer: Self-pay | Admitting: Endocrinology

## 2020-06-27 NOTE — Telephone Encounter (Signed)
Last OV--05/16/20.Marland Kitchen Please advise

## 2020-07-27 ENCOUNTER — Other Ambulatory Visit: Payer: Self-pay | Admitting: Internal Medicine

## 2020-07-29 NOTE — Telephone Encounter (Signed)
This is Dr. Ronnie Derby pt. I refilled it last time because he was out of town.

## 2020-07-29 NOTE — Telephone Encounter (Signed)
Please advise 

## 2020-08-09 ENCOUNTER — Other Ambulatory Visit: Payer: Self-pay

## 2020-08-09 ENCOUNTER — Other Ambulatory Visit (INDEPENDENT_AMBULATORY_CARE_PROVIDER_SITE_OTHER): Payer: BC Managed Care – PPO

## 2020-08-09 DIAGNOSIS — M2550 Pain in unspecified joint: Secondary | ICD-10-CM | POA: Diagnosis not present

## 2020-08-09 DIAGNOSIS — E230 Hypopituitarism: Secondary | ICD-10-CM

## 2020-08-09 LAB — BASIC METABOLIC PANEL
BUN: 20 mg/dL (ref 6–23)
CO2: 28 mEq/L (ref 19–32)
Calcium: 9.5 mg/dL (ref 8.4–10.5)
Chloride: 105 mEq/L (ref 96–112)
Creatinine, Ser: 0.98 mg/dL (ref 0.40–1.50)
GFR: 87 mL/min (ref >60.00)
Glucose, Bld: 84 mg/dL (ref 70–99)
Potassium: 4.2 mEq/L (ref 3.5–5.1)
Sodium: 140 mEq/L (ref 135–145)

## 2020-08-09 LAB — T4, FREE: Free T4: 0.77 ng/dL (ref 0.60–1.60)

## 2020-08-09 LAB — CBC
HCT: 44.1 % (ref 39.0–52.0)
Hemoglobin: 15.2 g/dL (ref 13.0–17.0)
MCHC: 34.4 g/dL (ref 30.0–36.0)
MCV: 91.1 fl (ref 78.0–100.0)
Platelets: 315 10*3/uL (ref 150.0–400.0)
RBC: 4.84 Mil/uL (ref 4.22–5.81)
RDW: 13.2 % (ref 11.5–15.5)
WBC: 8 10*3/uL (ref 4.0–10.5)

## 2020-08-09 LAB — TESTOSTERONE: Testosterone: 459.59 ng/dL (ref 300.00–890.00)

## 2020-08-12 NOTE — Progress Notes (Signed)
Patient ID: Brandon Andrews, male   DOB: March 03, 1966, 55 y.o.   MRN: [id number]            Chief complaint: Endocrinology follow-up  History of Present Illness:  History obtained at initial visit on 03/27/2020  He started having multiple symptoms seen in June 2021 Initially was having some headaches which were intermittent Also had symptoms of feeling more tired and weak and feeling exhausted He also had some difficulty with thinking clearly or finding the right words Subsequently he also started losing weight when he was weak and probably lost about 25 pounds He was having hiccups but no symptoms of nausea or vomiting Was having some dizziness at times but not significant lightheadedness or syncope. Also has had some loose stools Additionally he was having problems with pain in all his joints and felt like bone pain Is also complaining of some spasms in his extremities but not cramps In October 21 because of significant joint pain he was seen by a rheumatologist Although his work-up was negative he was given prednisone 10 mg twice daily which he thinks he took for about 3 weeks While taking prednisone he had normalization of his energy level, improved mood, decreased pain and no weakness He was taken off prednisone in early December  RECENT HISTORY:  He has had HYPOPITUITARISM as follows   Symptoms of initial weakness, weight loss and decreased appetite as well as blood pressure noted to be low with PCP.  Subsequently labs indicated undetectable cortisol levels even after ACTH stimulation ACTH level undetectable Synthetic glucocorticoid screen was negative  He was started on hydrocortisone 20 mg in the morning and 10 mg in the late afternoon on 03/29/2020 With this he has had significant improvement of all his symptoms including joint pains and weakness/fatigue  Appetite continues to be normal, has been gaining back weight although his weight before he had onset of symptoms was  215 No complaints of weakness or excessive fatigue No lightheadedness Sodium level is normal   TESTOSTERONE deficiency: Although he reportedly had testosterone deficiency for 10 to 15 years and previously treated with AndroGel he only started with symptoms of fatigue since late 2021.  Most of his testosterone levels had been over 300 through PCP office  Since his free testosterone level is significantly low with normal LH level he has been started on AndroGel 1.62% Has been applying this regularly He was prescribed 1 pump on each shoulder and he has been following the directions since his last visit in 2/22  He feels that his energy level did improve with increasing the dose also has not had any issues with libido Total testosterone is relatively better  Prolactin level at baseline was increased at 26, subsequently back to normal  Lab Results  Component Value Date   TESTOFREE 4.5 (L) 05/14/2020   TESTOFREE 2.8 (L) 03/28/2020      HYPOTHYROIDISM: Free T4 level 0.41, low, done on 11/14/2019 In August 2021 he had a mild increase in TSH of 5.05 and was started on 25 mcg of levothyroxine without any benefit Subsequently free T4 has been normal, he was advised to stop levothyroxine Free T4 is about the same in the normal range  Lab Results  Component Value Date   TSH 3.31 05/14/2020   FREET4 0.77 08/09/2020   FREET4 0.79 05/14/2020   FREET4 0.78 04/12/2020     IGF-I normal in 12/21   MRI of pituitary gland on 04/10/2020 showed normal pituitary gland No headaches  Past Medical History:  Diagnosis Date  . Arthritis   . Depression   . Hemangioma of liver     Past Surgical History:  Procedure Laterality Date  . JOINT REPLACEMENT     Bilateral  . VASECTOMY      Family History  Problem Relation Age of Onset  . Thyroid cancer Sister   . Diabetes Paternal Grandmother     Social History:  reports that he has never smoked. He has never used smokeless tobacco. He reports  that he does not drink alcohol and does not use drugs.  Allergies: No Known Allergies  Allergies as of 08/13/2020   No Known Allergies     Medication List       Accurate as of Aug 13, 2020  8:14 AM. If you have any questions, ask your nurse or doctor.        buPROPion 300 MG 24 hr tablet Commonly known as: WELLBUTRIN XL Take 300 mg by mouth every morning.   hydrocortisone 20 MG tablet Commonly known as: CORTEF 1 tablet in the morning and half tablet at 5 PM   Testosterone 1.62 % Gel Apply 1 pump on each shoulder daily       LABS:  Lab on 08/09/2020  Component Date Value Ref Range Status  . Sodium 08/09/2020 140  135 - 145 mEq/L Final  . Potassium 08/09/2020 4.2  3.5 - 5.1 mEq/L Final  . Chloride 08/09/2020 105  96 - 112 mEq/L Final  . CO2 08/09/2020 28  19 - 32 mEq/L Final  . Glucose, Bld 08/09/2020 84  70 - 99 mg/dL Final  . BUN 08/09/2020 20  6 - 23 mg/dL Final  . Creatinine, Ser 08/09/2020 0.98  0.40 - 1.50 mg/dL Final  . GFR 08/09/2020 87.00  >60.00 mL/min Final   Calculated using the CKD-EPI Creatinine Equation (2021)  . Calcium 08/09/2020 9.5  8.4 - 10.5 mg/dL Final  . WBC 08/09/2020 8.0  4.0 - 10.5 K/uL Final  . RBC 08/09/2020 4.84  4.22 - 5.81 Mil/uL Final  . Platelets 08/09/2020 315.0  150.0 - 400.0 K/uL Final  . Hemoglobin 08/09/2020 15.2  13.0 - 17.0 g/dL Final  . HCT 08/09/2020 44.1  39.0 - 52.0 % Final  . MCV 08/09/2020 91.1  78.0 - 100.0 fl Final  . MCHC 08/09/2020 34.4  30.0 - 36.0 g/dL Final  . RDW 08/09/2020 13.2  11.5 - 15.5 % Final  . Testosterone 08/09/2020 459.59  300.00 - 890.00 ng/dL Final  . Free T4 08/09/2020 0.77  0.60 - 1.60 ng/dL Final   Comment: Specimens from patients who are undergoing biotin therapy and /or ingesting biotin supplements may contain high levels of biotin.  The higher biotin concentration in these specimens interferes with this Free T4 assay.  Specimens that contain high levels  of biotin may cause false high results  for this Free T4 assay.  Please interpret results in light of the total clinical presentation of the patient.          Review of Systems   Has had some weight gain recently  Wt Readings from Last 3 Encounters:  08/13/20 194 lb (88 kg)  05/16/20 188 lb 3.2 oz (85.4 kg)  04/15/20 172 lb (78 kg)   He thinks that sometimes he does not have some emotional symptoms also but he is on Wellbutrin from PCP  He was having a rash on his legs for the last couple of months but this is better now, did  not follow-up with his PCP regarding this  PHYSICAL EXAM:  BP 124/78   Pulse (!) 54   Ht 5\' 11"  (1.803 m)   Wt 194 lb (88 kg)   SpO2 98%   BMI 27.06 kg/m    Standing blood pressure 120/80 No cushingoid features of central obesity or buffalo hump Skin does not thin, no ecchymoses  ASSESSMENT:    Partial hypopituitarism related to likely episode of hypophysitis in August 2021, etiology unclear  Subsequently had had persistent evidence of hypogonadism and severe cortisol deficiency.  He is now on full therapeutic doses of hydrocortisone with improved his fatigue and arthralgia  Weight gain likely to be from regaining most of the lost weight from his acute episode last year and continued muscle build up with exercise  May consider 24-hour urine free cortisol with dexamethasone in about 6 months  Testosterone level improved and he is now on AndroGel 1.62%, 2 pumps daily, able to exercise  No complaints of weakness or significant fatigue  MRI previously showed no structural pituitary disease  History of pruritic skin rash: Resolved, may have been tinea corporis, advised him to let us know if he needs a referral to a dermatologist if this recurs  PLAN:    Continue hydrocortisone 20 mg in the morning and 10 mg in the late afternoon  Discussed that repeat MRI of the pituitary Is unlikely to show any change  AndroGel 1 pump on each arm and before  Follow-up in 6 months unless  having new symptoms and will recheck same labs    Elayne Snare 08/13/2020, 8:14 AM

## 2020-08-13 ENCOUNTER — Encounter: Payer: Self-pay | Admitting: Endocrinology

## 2020-08-13 ENCOUNTER — Ambulatory Visit (INDEPENDENT_AMBULATORY_CARE_PROVIDER_SITE_OTHER): Payer: BC Managed Care – PPO | Admitting: Endocrinology

## 2020-08-13 ENCOUNTER — Other Ambulatory Visit: Payer: Self-pay

## 2020-08-13 VITALS — BP 124/78 | HR 54 | Ht 71.0 in | Wt 194.0 lb

## 2020-08-13 DIAGNOSIS — E221 Hyperprolactinemia: Secondary | ICD-10-CM

## 2020-08-13 DIAGNOSIS — E230 Hypopituitarism: Secondary | ICD-10-CM | POA: Diagnosis not present

## 2020-08-16 ENCOUNTER — Telehealth: Payer: Self-pay | Admitting: *Deleted

## 2020-08-16 ENCOUNTER — Other Ambulatory Visit: Payer: Self-pay | Admitting: Internal Medicine

## 2020-08-17 ENCOUNTER — Other Ambulatory Visit: Payer: Self-pay | Admitting: Endocrinology

## 2020-09-13 ENCOUNTER — Other Ambulatory Visit: Payer: Self-pay | Admitting: Endocrinology

## 2020-09-18 ENCOUNTER — Other Ambulatory Visit: Payer: Self-pay | Admitting: Endocrinology

## 2021-02-10 ENCOUNTER — Other Ambulatory Visit (INDEPENDENT_AMBULATORY_CARE_PROVIDER_SITE_OTHER): Payer: BC Managed Care – PPO

## 2021-02-10 ENCOUNTER — Other Ambulatory Visit: Payer: BC Managed Care – PPO

## 2021-02-10 DIAGNOSIS — E221 Hyperprolactinemia: Secondary | ICD-10-CM

## 2021-02-10 DIAGNOSIS — E230 Hypopituitarism: Secondary | ICD-10-CM

## 2021-02-10 LAB — T4, FREE: Free T4: 0.88 ng/dL (ref 0.60–1.60)

## 2021-02-10 LAB — CBC
HCT: 43.2 % (ref 39.0–52.0)
Hemoglobin: 14.6 g/dL (ref 13.0–17.0)
MCHC: 33.7 g/dL (ref 30.0–36.0)
MCV: 90.7 fl (ref 78.0–100.0)
Platelets: 340 10*3/uL (ref 150.0–400.0)
RBC: 4.76 Mil/uL (ref 4.22–5.81)
RDW: 13.3 % (ref 11.5–15.5)
WBC: 7.9 10*3/uL (ref 4.0–10.5)

## 2021-02-10 LAB — BASIC METABOLIC PANEL
BUN: 17 mg/dL (ref 6–23)
CO2: 29 mEq/L (ref 19–32)
Calcium: 9.4 mg/dL (ref 8.4–10.5)
Chloride: 104 mEq/L (ref 96–112)
Creatinine, Ser: 0.97 mg/dL (ref 0.40–1.50)
GFR: 87.77 mL/min (ref >60.00)
Glucose, Bld: 79 mg/dL (ref 70–99)
Potassium: 4.6 mEq/L (ref 3.5–5.1)
Sodium: 139 mEq/L (ref 135–145)

## 2021-02-10 LAB — TSH: TSH: 1.92 u[IU]/mL (ref 0.35–5.50)

## 2021-02-13 ENCOUNTER — Encounter: Payer: Self-pay | Admitting: Endocrinology

## 2021-02-13 ENCOUNTER — Other Ambulatory Visit: Payer: Self-pay

## 2021-02-13 ENCOUNTER — Other Ambulatory Visit: Payer: Self-pay | Admitting: Endocrinology

## 2021-02-13 ENCOUNTER — Ambulatory Visit (INDEPENDENT_AMBULATORY_CARE_PROVIDER_SITE_OTHER): Payer: BC Managed Care – PPO | Admitting: Endocrinology

## 2021-02-13 VITALS — BP 106/90 | HR 66 | Ht 70.5 in | Wt 202.2 lb

## 2021-02-13 DIAGNOSIS — E230 Hypopituitarism: Secondary | ICD-10-CM | POA: Diagnosis not present

## 2021-02-13 MED ORDER — DEXAMETHASONE 0.75 MG PO TABS: ORAL_TABLET | ORAL | 0 refills | Status: DC

## 2021-02-13 NOTE — Patient Instructions (Signed)
Take 2x or 3x dose hydrocortisone if acutely sick for duration of illness

## 2021-02-13 NOTE — Progress Notes (Signed)
Patient ID: Brandon Andrews, male   DOB: 07-30-1965, 55 y.o.   MRN: [id number]            Chief complaint: Endocrinology follow-up  History of Present Illness:  History obtained at initial visit on 03/27/2020  He started having multiple symptoms seen in June 2021 Initially was having some headaches which were intermittent Also had symptoms of feeling more tired and weak and feeling exhausted He also had some difficulty with thinking clearly or finding the right words Subsequently he also started losing weight when he was weak and probably lost about 25 pounds He was having hiccups but no symptoms of nausea or vomiting Was having some dizziness at times but not significant lightheadedness or syncope. Also has had some loose stools Additionally he was having problems with pain in all his joints and felt like bone pain Is also complaining of some spasms in his extremities but not cramps In October 21 because of significant joint pain he was seen by a rheumatologist Although his work-up was negative he was given prednisone 10 mg twice daily which he thinks he took for about 3 weeks While taking prednisone he had normalization of his energy level, improved mood, decreased pain and no weakness He was taken off prednisone in early December  RECENT HISTORY:  He has had HYPOPITUITARISM with the following hormone deficiencies  Cortisol deficiency Symptoms of initial weakness, weight loss and decreased appetite as well as blood pressure noted to be low with PCP.  Subsequently labs indicated undetectable cortisol levels even after ACTH stimulation ACTH level undetectable Synthetic glucocorticoid screen was negative  He was started on hydrocortisone 20 mg in the morning and 10 mg in the late afternoon on 03/29/2020 Has no recurrence of symptoms including joint pains and weakness/fatigue  He has no nausea or decreased appetite is also no lightheadedness or weakness Again has been gaining back  weight gradually, his baseline weight was about 215  Blood pressure not low standing up  Sodium level is normal  TESTOSTERONE deficiency: Although he reportedly had testosterone deficiency for 10 to 15 years and previously treated with AndroGel he only started with symptoms of fatigue since late 2021.  Most of his testosterone levels had been over 300 through PCP office  Since his free testosterone level at baseline was significantly low with normal LH level he has been started on AndroGel 1.62% Has been applying this regularly He was prescribed 1 pump on each shoulder   He feels that his energy level did improve with increasing the dose, no complaints of decreased libido When he could not get the medication for a month he did not feel worse but felt improved energy when starting back No history of decreased libido Total testosterone is relatively lower and free testosterone pending  Prolactin level at baseline was increased at 26, subsequently back to normal  Lab Results  Component Value Date   TESTOFREE WILL FOLLOW 02/10/2021   TESTOFREE 4.5 (L) 05/14/2020   TESTOFREE 2.8 (L) 03/28/2020     HYPOTHYROIDISM: Free T4 level 0.41, low, done on 11/14/2019 In August 2021 he had a mild increase in TSH of 5.05 and was started on 25 mcg of levothyroxine without any benefit Subsequently free T4 has been normal, he was advised to stop levothyroxine  Free T4 is consistently normal  Lab Results  Component Value Date   TSH 1.92 02/10/2021   TSH 3.31 05/14/2020   FREET4 0.88 02/10/2021   FREET4 0.77 08/09/2020   FREET4  0.79 05/14/2020    IGF-I normal in 12/21  MRI of pituitary gland on 04/10/2020 showed normal pituitary gland No headaches  Past Medical History:  Diagnosis Date   Arthritis    Depression    Hemangioma of liver     Past Surgical History:  Procedure Laterality Date   JOINT REPLACEMENT     Bilateral   VASECTOMY      Family History  Problem Relation Age of  Onset   Thyroid cancer Sister    Diabetes Paternal Grandmother     Social History:  reports that he has never smoked. He has never used smokeless tobacco. He reports that he does not drink alcohol and does not use drugs.  Allergies: No Known Allergies  Allergies as of 02/13/2021   No Known Allergies      Medication List        Accurate as of February 13, 2021  8:12 AM. If you have any questions, ask your nurse or doctor.          buPROPion 300 MG 24 hr tablet Commonly known as: WELLBUTRIN XL Take 300 mg by mouth every morning.   hydrocortisone 20 MG tablet Commonly known as: CORTEF 1 tablet in the morning and half tablet at 5 PM   Testosterone 1.62 % Gel Apply 1 pump on each shoulder daily        LABS:  Appointment on 02/10/2021  Component Date Value Ref Range Status   Testosterone 02/10/2021 316  264 - 916 ng/dL Final   Comment: Adult male reference interval is based on a population of healthy nonobese males (BMI <30) between 16 and 55 years old. [place], [place] [phone]. PMID: [id number].    Testosterone, Free 02/10/2021 WILL FOLLOW   Preliminary   Sex Hormone Binding 02/10/2021 35.3  19.3 - 76.4 nmol/L Final   Prolactin 02/10/2021 11.4  4.0 - 15.2 ng/mL Final   TSH 02/10/2021 1.92  0.35 - 5.50 uIU/mL Final   Free T4 02/10/2021 0.88  0.60 - 1.60 ng/dL Final   Comment: Specimens from patients who are undergoing biotin therapy and /or ingesting biotin supplements may contain high levels of biotin.  The higher biotin concentration in these specimens interferes with this Free T4 assay.  Specimens that contain high levels  of biotin may cause false high results for this Free T4 assay.  Please interpret results in light of the total clinical presentation of the patient.     Sodium 02/10/2021 139  135 - 145 mEq/L Final   Potassium 02/10/2021 4.6  3.5 - 5.1 mEq/L Final   Chloride 02/10/2021 104  96 - 112 mEq/L Final   CO2 02/10/2021 29  19 - 32 mEq/L  Final   Glucose, Bld 02/10/2021 79  70 - 99 mg/dL Final   BUN 02/10/2021 17  6 - 23 mg/dL Final   Creatinine, Ser 02/10/2021 0.97  0.40 - 1.50 mg/dL Final   GFR 02/10/2021 87.77  >60.00 mL/min Final   Calculated using the CKD-EPI Creatinine Equation (2021)   Calcium 02/10/2021 9.4  8.4 - 10.5 mg/dL Final   WBC 02/10/2021 7.9  4.0 - 10.5 K/uL Final   RBC 02/10/2021 4.76  4.22 - 5.81 Mil/uL Final   Platelets 02/10/2021 340.0  150.0 - 400.0 K/uL Final   Hemoglobin 02/10/2021 14.6  13.0 - 17.0 g/dL Final   HCT 02/10/2021 43.2  39.0 - 52.0 % Final   MCV 02/10/2021 90.7  78.0 - 100.0 fl Final   MCHC 02/10/2021 33.7  30.0 - 36.0 g/dL Final   RDW 02/10/2021 13.3  11.5 - 15.5 % Final        Review of Systems  Weight history  Wt Readings from Last 3 Encounters:  02/13/21 202 lb 3.2 oz (91.7 kg)  08/13/20 194 lb (88 kg)  05/16/20 188 lb 3.2 oz (85.4 kg)   He thinks he has had a little more depression than before but has not discussed with his PCP, still on Wellbutrin  He has had episode of COVID, otherwise has had cold/viral illnesses which he thinks are unusual and lasting longer than usual  PHYSICAL EXAM:  BP 106/90 (Patient Position: Standing)   Pulse 66   Ht 5' 10.5" (1.791 m)   Wt 202 lb 3.2 oz (91.7 kg)   SpO2 96%   BMI 28.60 kg/m    Heart rate regular and about 70  ASSESSMENT:   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 He is on full therapeutic doses of hydrocortisone with improved his fatigue and arthralgia  Subjectively doing very well as above with no clinical signs or symptoms of adrenal insufficiency, continues to be androgen  Weight gain likely to be from regaining weight back towards his baseline  MRI previously showed no structural pituitary disease  Symptoms of depression: He will discuss with PCP    PLAN:   Continue  hydrocortisone 20 mg in the morning and 10 mg around 5 PM as before Assessment of endogenous cortisol production with using dexamethasone for 2 days, he will be given a 24 urine collection container for this Discussed that if he has any acute illness especially with fever he will need to double or triple his hydrocortisone dose temporarily as stress doses AndroGel to be adjusted based on his final testosterone results Follow-up in 6 months unless having new symptoms and will recheck same labs No need for thyroid supplementation   Elayne Snare 02/13/2021, 8:12 AM

## 2021-02-14 LAB — TESTOSTERONE, FREE, TOTAL, SHBG
Sex Hormone Binding: 35.3 nmol/L (ref 19.3–76.4)
Testosterone, Free: 2.7 pg/mL — ABNORMAL LOW (ref 7.2–24.0)
Testosterone: 316 ng/dL (ref 264–916)

## 2021-02-14 LAB — PROLACTIN: Prolactin: 11.4 ng/mL (ref 4.0–15.2)

## 2021-02-25 ENCOUNTER — Other Ambulatory Visit: Payer: Self-pay | Admitting: Endocrinology

## 2021-02-25 MED ORDER — TESTOSTERONE 1.62 % TD GEL
TRANSDERMAL | 3 refills | Status: DC
Start: 2021-02-25 — End: 2021-05-23

## 2021-02-27 ENCOUNTER — Other Ambulatory Visit: Payer: BC Managed Care – PPO

## 2021-02-27 ENCOUNTER — Other Ambulatory Visit: Payer: Self-pay

## 2021-02-27 DIAGNOSIS — E230 Hypopituitarism: Secondary | ICD-10-CM | POA: Diagnosis not present

## 2021-02-28 LAB — CREATININE, URINE, 24 HOUR
Creatinine, 24H Ur: 1660 mg/(24.h) (ref 1000–2000)
Creatinine, Urine: 118.6 mg/dL

## 2021-03-07 DIAGNOSIS — E2740 Unspecified adrenocortical insufficiency: Secondary | ICD-10-CM | POA: Diagnosis not present

## 2021-03-07 DIAGNOSIS — F3341 Major depressive disorder, recurrent, in partial remission: Secondary | ICD-10-CM | POA: Diagnosis not present

## 2021-03-07 DIAGNOSIS — Z13220 Encounter for screening for lipoid disorders: Secondary | ICD-10-CM | POA: Diagnosis not present

## 2021-03-07 DIAGNOSIS — Z0000 Encounter for general adult medical examination without abnormal findings: Secondary | ICD-10-CM | POA: Diagnosis not present

## 2021-03-07 DIAGNOSIS — Z125 Encounter for screening for malignant neoplasm of prostate: Secondary | ICD-10-CM | POA: Diagnosis not present

## 2021-03-07 DIAGNOSIS — E039 Hypothyroidism, unspecified: Secondary | ICD-10-CM | POA: Diagnosis not present

## 2021-03-07 LAB — CORTISOL, URINE, FREE
Cortisol (Ur), Free: <1 ug/24 hr — ABNORMAL LOW (ref 5–64)
Cortisol,F,ug/L,U: <1 ug/L

## 2021-03-14 DIAGNOSIS — H5212 Myopia, left eye: Secondary | ICD-10-CM | POA: Diagnosis not present

## 2021-03-25 ENCOUNTER — Other Ambulatory Visit: Payer: Self-pay | Admitting: Endocrinology

## 2021-04-02 ENCOUNTER — Other Ambulatory Visit (HOSPITAL_COMMUNITY): Payer: Self-pay

## 2021-04-02 ENCOUNTER — Telehealth: Payer: Self-pay

## 2021-04-02 NOTE — Telephone Encounter (Signed)
No PA is needed for the pts Testosterone 1.62% gel.  The copay is $10 for a 30 day supply.

## 2021-04-28 ENCOUNTER — Other Ambulatory Visit (HOSPITAL_COMMUNITY): Payer: Self-pay

## 2021-04-28 ENCOUNTER — Telehealth: Payer: Self-pay | Admitting: Pharmacy Technician

## 2021-04-28 NOTE — Telephone Encounter (Signed)
Patient Advocate Encounter  Prior Authorization for Testosterone 1.62% gel has been approved.    PA# PA Case ID: [id number] Effective dates: 03/29/21 through 04/28/22  Pharmacy filled.  Patient Advocate Fax:  [phone]

## 2021-04-28 NOTE — Telephone Encounter (Signed)
Patient Advocate Encounter   Received notification from CoverMyMeds that prior authorization for Testosterone is required.  Current ins: [employer] (in [place])   PA submitted on 04/28/21 Key [id number] Status is pending  Called pharmacy due to test claim saying a claim was filed today. Pharmacy hasn't filled it, though and get the same rejection.    [facility] will continue to follow:

## 2021-05-23 ENCOUNTER — Encounter: Payer: Self-pay | Admitting: Endocrinology

## 2021-05-23 ENCOUNTER — Other Ambulatory Visit: Payer: Self-pay | Admitting: Endocrinology

## 2021-05-23 MED ORDER — TESTOSTERONE 1.62 % TD GEL: TRANSDERMAL | 3 refills | Status: DC

## 2021-08-12 ENCOUNTER — Other Ambulatory Visit (INDEPENDENT_AMBULATORY_CARE_PROVIDER_SITE_OTHER): Payer: BC Managed Care – PPO

## 2021-08-12 ENCOUNTER — Other Ambulatory Visit: Payer: Self-pay | Admitting: Endocrinology

## 2021-08-12 DIAGNOSIS — E230 Hypopituitarism: Secondary | ICD-10-CM

## 2021-08-12 LAB — BASIC METABOLIC PANEL
BUN: 17 mg/dL (ref 6–23)
CO2: 27 mEq/L (ref 19–32)
Calcium: 9.6 mg/dL (ref 8.4–10.5)
Chloride: 106 mEq/L (ref 96–112)
Creatinine, Ser: 1.08 mg/dL (ref 0.40–1.50)
GFR: 76.88 mL/min (ref >60.00)
Glucose, Bld: 90 mg/dL (ref 70–99)
Potassium: 4.8 mEq/L (ref 3.5–5.1)
Sodium: 140 mEq/L (ref 135–145)

## 2021-08-12 LAB — CBC
HCT: 43.6 % (ref 39.0–52.0)
Hemoglobin: 14.6 g/dL (ref 13.0–17.0)
MCHC: 33.6 g/dL (ref 30.0–36.0)
MCV: 93.6 fl (ref 78.0–100.0)
Platelets: 300 10*3/uL (ref 150.0–400.0)
RBC: 4.66 Mil/uL (ref 4.22–5.81)
RDW: 13.9 % (ref 11.5–15.5)
WBC: 7.4 10*3/uL (ref 4.0–10.5)

## 2021-08-12 LAB — TESTOSTERONE: Testosterone: 327.25 ng/dL (ref 300.00–890.00)

## 2021-08-12 LAB — T4, FREE: Free T4: 0.93 ng/dL (ref 0.60–1.60)

## 2021-08-14 NOTE — Progress Notes (Signed)
Patient ID: Brandon Andrews, male   DOB: 10-28-1965, 56 y.o.   MRN: [id number]            Chief complaint: Endocrinology follow-up  History of Present Illness:  History obtained at initial visit on 03/27/2020  He started having multiple symptoms seen in June 2021 Initially was having some headaches which were intermittent Also had symptoms of feeling more tired and weak and feeling exhausted He also had some difficulty with thinking clearly or finding the right words Subsequently he also started losing weight when he was weak and probably lost about 25 pounds He was having hiccups but no symptoms of nausea or vomiting Was having some dizziness at times but not significant lightheadedness or syncope. Also has had some loose stools Additionally he was having problems with pain in all his joints and felt like bone pain Is also complaining of some spasms in his extremities but not cramps In October 21 because of significant joint pain he was seen by a rheumatologist Although his work-up was negative he was given prednisone 10 mg twice daily which he thinks he took for about 3 weeks While taking prednisone he had normalization of his energy level, improved mood, decreased pain and no weakness He was taken off prednisone in early December  RECENT HISTORY:  He has had HYPOPITUITARISM with the following hormone deficiencies  Cortisol deficiency Symptoms of initial weakness, weight loss and decreased appetite as well as blood pressure noted to be low with PCP.  Subsequently labs indicated undetectable cortisol levels even after ACTH stimulation ACTH level undetectable Synthetic glucocorticoid screen was negative  He was started on hydrocortisone 20 mg in the morning and 10 mg in the late afternoon on 03/29/2020 With this he had complete resolution of symptoms including joint pains and weakness/fatigue  Recently complaints of nausea or decreased appetite, no weakness or joint pains Weight  has now leveled off after initial gain  Sodium level has been consistently normal Urine free cortisol on dexamethasone 11/22 was undetectable  TESTOSTERONE deficiency: Although he reportedly had testosterone deficiency for 10 to 15 years and previously treated with AndroGel he only started with symptoms of fatigue since late 2021.  Most of his testosterone levels had been over 300 through PCP office  Since his free testosterone level at baseline was significantly low with normal LH level he has been started on AndroGel 1.62% Has been applying this regularly He was prescribed 1 pump on 1 shoulder and 2 on the other  He is able to do significant amount of exercise without any fatigue or weakness No history of decreased libido Total testosterone is stable over 300  Prolactin level at baseline was increased at 26, subsequently back to normal  Lab Results  Component Value Date   TESTOSTERONE 327.25 08/12/2021   TESTOSTERONE 316 02/10/2021   TESTOSTERONE 459.59 08/09/2020   TESTOSTERONE 395 05/14/2020      HYPOTHYROIDISM: Free T4 level 0.41, low, done on 11/14/2019 In August 2021 he had a mild increase in TSH of 5.05 and was started on 25 mcg of levothyroxine without any benefit Subsequently free T4 has been normal without levothyroxine supplement  Free T4 is again normal  Lab Results  Component Value Date   TSH 1.92 02/10/2021   TSH 3.31 05/14/2020   FREET4 0.93 08/12/2021   FREET4 0.88 02/10/2021   FREET4 0.77 08/09/2020    IGF-I normal in 12/21  MRI of pituitary gland on 04/10/2020 showed normal pituitary gland No headaches  Past  Medical History:  Diagnosis Date   Arthritis    Depression    Hemangioma of liver     Past Surgical History:  Procedure Laterality Date   JOINT REPLACEMENT     Bilateral   VASECTOMY      Family History  Problem Relation Age of Onset   Hypertension Father    Thyroid cancer Sister    Diabetes Paternal Grandmother     Social  History:  reports that he has never smoked. He has never used smokeless tobacco. He reports that he does not drink alcohol and does not use drugs.  Allergies: No Known Allergies  Allergies as of 08/15/2021   No Known Allergies      Medication List        Accurate as of Aug 15, 2021  8:27 AM. If you have any questions, ask your nurse or doctor.          buPROPion 300 MG 24 hr tablet Commonly known as: WELLBUTRIN XL Take 300 mg by mouth every morning.   dexamethasone 0.75 MG tablet Commonly known as: DECADRON 1 tablet once a day for 2 days.   Collect urine sample on second day.  Stop hydrocortisone while taking this   hydrocortisone 20 MG tablet Commonly known as: CORTEF 1 tablet in the morning and half tablet at 5 PM   Testosterone 1.62 % Gel Apply 2 pumps on 1 shoulder and 1 on the other every day after shower        LABS:  Lab on 08/12/2021  Component Date Value Ref Range Status   Sodium 08/12/2021 140  135 - 145 mEq/L Final   Potassium 08/12/2021 4.8  3.5 - 5.1 mEq/L Final   Chloride 08/12/2021 106  96 - 112 mEq/L Final   CO2 08/12/2021 27  19 - 32 mEq/L Final   Glucose, Bld 08/12/2021 90  70 - 99 mg/dL Final   BUN 08/12/2021 17  6 - 23 mg/dL Final   Creatinine, Ser 08/12/2021 1.08  0.40 - 1.50 mg/dL Final   GFR 08/12/2021 76.88  >60.00 mL/min Final   Calculated using the CKD-EPI Creatinine Equation (2021)   Calcium 08/12/2021 9.6  8.4 - 10.5 mg/dL Final   Free T4 08/12/2021 0.93  0.60 - 1.60 ng/dL Final   Comment: Specimens from patients who are undergoing biotin therapy and /or ingesting biotin supplements may contain high levels of biotin.  The higher biotin concentration in these specimens interferes with this Free T4 assay.  Specimens that contain high levels  of biotin may cause false high results for this Free T4 assay.  Please interpret results in light of the total clinical presentation of the patient.     Testosterone 08/12/2021 327.25  300.00 -  890.00 ng/dL Final   WBC 08/12/2021 7.4  4.0 - 10.5 K/uL Final   RBC 08/12/2021 4.66  4.22 - 5.81 Mil/uL Final   Platelets 08/12/2021 300.0  150.0 - 400.0 K/uL Final   Hemoglobin 08/12/2021 14.6  13.0 - 17.0 g/dL Final   HCT 08/12/2021 43.6  39.0 - 52.0 % Final   MCV 08/12/2021 93.6  78.0 - 100.0 fl Final   MCHC 08/12/2021 33.6  30.0 - 36.0 g/dL Final   RDW 08/12/2021 13.9  11.5 - 15.5 % Final        Review of Systems  Today he is asking about episodes of what he thinks is stress He periodically will have a sensation of feeling warm or flushed especially in his face  and head area along with tender skin breakouts which can occur either in his extremities or head and neck area.  He also appears to have weakness at that time but no sweating At that time he also appears to have feeling of rapid heartbeat and headache  When he feels the symptoms he also feels some alteration in his mood and difficulty with mental focus he  BLOOD PRESSURE has been somewhat high at times and at times at home his diastolic will be 16-94 He has not discussed this with any PCP  BP Readings from Last 3 Encounters:  08/15/21 130/86  02/13/21 106/90  08/13/20 124/78     Weight history he thinks he has difficulty losing weight despite working out aggressively and usually having around 2000-calorie intake a day  Wt Readings from Last 3 Encounters:  08/15/21 203 lb (92.1 kg)  02/13/21 202 lb 3.2 oz (91.7 kg)  08/13/20 194 lb (88 kg)    He has had episode of COVID, otherwise has had cold/viral illnesses which he thinks are unusual and lasting longer than usual  PHYSICAL EXAM:  BP 130/86 (Patient Position: Standing)   Pulse 61   Ht '5\' 10"'$  (1.778 m)   Wt 203 lb (92.1 kg)   SpO2 99%   BMI 29.13 kg/m   Does not look cushingoid  ASSESSMENT:   Partial hypopituitarism related to likely episode of hypophysitis in August 2021, etiology unclear Subsequently had had persistent hypogonadism and severe  ADRENAL deficiency. He is on full therapeutic doses of hydrocortisone, has no further symptoms, previously had baseline marked fatigue and arthralgia  Subjectively doing very well as above with no clinical signs or symptoms of adrenal insufficiency He has taken his hydrocortisone consistently   Difficult with weight loss not explained from endocrine standpoint  Testosterone deficiency is adequately placed and he is not symptomatic now  Unusual episodes of skin rash, decreased mental ability, some palpitations and headaches of unclear etiology.  However because of his blood pressure being relatively higher may need to rule out with chromocytoma  PLAN:   Continue hydrocortisone 20 mg in the morning and 10 mg around 5 PM as before May reassess endogenous cortisol production on his next visit in 6 months Continue testosterone gel at the same dose  24 urine catecholamines   Elayne Snare 08/15/2021, 8:27 AM

## 2021-08-15 ENCOUNTER — Ambulatory Visit (INDEPENDENT_AMBULATORY_CARE_PROVIDER_SITE_OTHER): Payer: BC Managed Care – PPO | Admitting: Endocrinology

## 2021-08-15 ENCOUNTER — Encounter: Payer: Self-pay | Admitting: Endocrinology

## 2021-08-15 VITALS — BP 130/86 | HR 61 | Ht 70.0 in | Wt 203.0 lb

## 2021-08-15 DIAGNOSIS — I10 Essential (primary) hypertension: Secondary | ICD-10-CM | POA: Diagnosis not present

## 2021-08-15 DIAGNOSIS — R002 Palpitations: Secondary | ICD-10-CM | POA: Diagnosis not present

## 2021-08-15 DIAGNOSIS — E230 Hypopituitarism: Secondary | ICD-10-CM | POA: Diagnosis not present

## 2021-08-19 ENCOUNTER — Other Ambulatory Visit: Payer: BC Managed Care – PPO

## 2021-08-19 DIAGNOSIS — R002 Palpitations: Secondary | ICD-10-CM

## 2021-08-19 DIAGNOSIS — I10 Essential (primary) hypertension: Secondary | ICD-10-CM

## 2021-08-19 NOTE — Progress Notes (Unsigned)
Total volume 1,850. Started 08-18-21 at 8:00 am ended 08-19-21 at 8:00 a.m.

## 2021-08-20 LAB — CREATININE, URINE, 24 HOUR
Creatinine, 24H Ur: 1719 mg/24 hr (ref 1000–2000)
Creatinine, Urine: 92.9 mg/dL

## 2021-08-22 LAB — CATECHOLAMINES, FRACTIONATED, URINE, 24 HOUR
Dopamine , 24H Ur: 228 ug/24 hr (ref 0–510)
Dopamine, Rand Ur: 123 ug/L
Epinephrine, 24H Ur: <2 ug/24 hr (ref 0–20)
Epinephrine, Rand Ur: <1 ug/L
Norepinephrine, 24H Ur: 57 ug/24 hr (ref 0–135)
Norepinephrine, Rand Ur: 31 ug/L

## 2021-09-01 ENCOUNTER — Ambulatory Visit: Payer: BC Managed Care – PPO | Admitting: Podiatry

## 2021-09-05 ENCOUNTER — Encounter: Payer: Self-pay | Admitting: Family Medicine

## 2021-09-05 ENCOUNTER — Ambulatory Visit (INDEPENDENT_AMBULATORY_CARE_PROVIDER_SITE_OTHER): Payer: BC Managed Care – PPO | Admitting: Family Medicine

## 2021-09-05 VITALS — BP 122/80 | HR 60 | Temp 97.7°F | Ht 70.0 in | Wt 203.0 lb

## 2021-09-05 DIAGNOSIS — R61 Generalized hyperhidrosis: Secondary | ICD-10-CM | POA: Diagnosis not present

## 2021-09-05 DIAGNOSIS — R21 Rash and other nonspecific skin eruption: Secondary | ICD-10-CM | POA: Diagnosis not present

## 2021-09-05 DIAGNOSIS — E230 Hypopituitarism: Secondary | ICD-10-CM | POA: Diagnosis not present

## 2021-09-05 NOTE — Progress Notes (Signed)
New Patient Office Visit  Subjective:  Patient ID: Brandon Andrews, male    DOB: 1965/04/06  Age: 56 y.o. MRN: [id number]  CC:  Chief Complaint  Patient presents with   Establish Care    Need new PCP Had coffee, no food     HPI Brandon Andrews presents for new pt.  Dr. Kenton Kingfisher at [facility]  Adrenal failure-sees Dr. Juanetta Gosling covid vaccine-numbness in hands 2021, fatigue, not feeling well.  Saw PCP.  Getting worse Aug 2021-was so weak couldn't get out of bed for 3 wks and HR super low.  "System shut down".  Lost 50#.  Saw PCP-Thyroid off and labs all "off charts".  Referred endo-adrenal failure-Jan 2022-started on testosterone and HC.  Slowly improved. 05/2020-legs getting red itchy bumps upper thighs.  Otc lotion helped. Back of head as well. And mood changes-dep/anx.  Lasts for up to 2 wks and better and then recurs.  Had about 2-3 times till Nov2022.  And then felt better.    Then Nov/Dec-felt like "didn't want to live" but no plan.  Lasted till January. Started exercise in Dec again.  Healthy eating.  Has gained wt back and now can't lose. Bikes/swims/wts.   Felt great till April 2023 and leg and scalp bumps back and brain thing-only lasted few days, then May again-this time, felt wave of heat in chest and up and "consumed" him and bumps back of head and L neck lump(new)"size of grape" and painful-lasted 3 days only and resolved.  Moods still going on-not "bad but not great".     Saw Dr. Dwyane Dee mid May.   Worries.  No SI.  No wellbutrin for 2 yrs-didn't help and was on before all this hit.  Was on for 5 yrs.   Bumps-no blisters nor pus.just itchy and red.  Past Medical History:  Diagnosis Date   Adrenal failure ([employer])    Arthritis    Depression    Hemangioma of liver    Pituitary deficiency [facility])     Past Surgical History:  Procedure Laterality Date   JOINT REPLACEMENT Bilateral 2020   Bilateral hip replacement   SHOULDER SURGERY Bilateral    right, 1985, 2006, left, 2008    VASECTOMY     WRIST SURGERY Left 1991    Family History  Problem Relation Age of Onset   Hyperlipidemia Father    Heart disease Father    Hearing loss Father    Hypertension Father    Heart attack Father    Thyroid cancer Sister    Diabetes Paternal Grandmother     Social History   Socioeconomic History   Marital status: Married    Spouse name: Not on file   Number of children: 1   Years of education: Not on file   Highest education level: Not on file  Occupational History   Not on file  Tobacco Use   Smoking status: Never   Smokeless tobacco: Never  Substance and Sexual Activity   Alcohol use: Never   Drug use: Never   Sexual activity: Not on file  Other Topics Concern   Not on file  Social History Narrative   IT-ops mgr   Social Determinants of Health   Financial Resource Strain: Not on file  Food Insecurity: Not on file  Transportation Needs: Not on file  Physical Activity: Not on file  Stress: Not on file  Social Connections: Not on file  Intimate Partner Violence: Not on file    ROS  No ha/dizziness/visual changes No cp.  Occ palp. No edema No cough/sob Bp's 135/90 at times.  Abd pain/gi upset after BBQ then better, then ate again and recurred.    Objective:   Today's Vitals: BP 122/80   Pulse 60   Temp 97.7 F (36.5 C) (Temporal)   Ht '5\' 10"'$  (1.778 m)   Wt 203 lb (92.1 kg)   SpO2 99%   BMI 29.13 kg/m   Physical Exam  Gen: WDWN NAD wm HEENT: NCAT, conjunctiva not injected, sclera nonicteric TM WNL B, OP moist, no exudates  NECK:  supple, no thyromegaly, no nodes, no carotid bruits CARDIAC: RRR, S1S2+, no murmur. DP 2+B LUNGS: CTAB. No wheezes ABDOMEN:  BS+, soft, NTND, No HSM, no masses EXT:  no edema MSK: no gross abnormalities.  NEURO: A&O x3.  CN II-XII intact.  PSYCH: normal mood. Good eye contact   Reviewed Dr. Dwyane Dee last note and labs.   Assessment & Plan:   Problem List Items Addressed This Visit       Endocrine    Hypopituitarism ([place])   Other Visit Diagnoses     Night sweats    -  Primary   Relevant Orders   5 HIAA, quantitative, urine, 24 hour   Rash          Sweats-only the 1 episode, but has a lot of other strange symptoms  will check 5H1AA to r/o carcinoid Hypopituitarism-chronic.  Stable on HC and testosterone.  Managed by Endo. Rash-intermitt and then will have mood changes-? Viral encephalopathy.  Advised to see Derm and see if they have any insight.    Outpatient Encounter Medications as of 09/05/2021  Medication Sig   hydrocortisone (CORTEF) 20 MG tablet 1 tablet in the morning and half tablet at 5 PM   Testosterone 1.62 % GEL Apply 2 pumps on 1 shoulder and 1 on the other every day after shower   [DISCONTINUED] buPROPion (WELLBUTRIN XL) 300 MG 24 hr tablet Take 300 mg by mouth every morning. (Patient not taking: Reported on 02/13/2021)   [DISCONTINUED] dexamethasone (DECADRON) 0.75 MG tablet 1 tablet once a day for 2 days.   Collect urine sample on second day.  Stop hydrocortisone while taking this (Patient not taking: Reported on 08/15/2021)   No facility-administered encounter medications on file as of 09/05/2021.    Follow-up: Return in about 1 month (around 10/05/2021) for 1-2 mo, weakness.   Wellington Hampshire, MD

## 2021-09-05 NOTE — Patient Instructions (Addendum)
Welcome to [facility]! It was a pleasure meeting you today.  As discussed, Please schedule a 1 month follow up visit today.  Keep log mammal meat. (Alpha-gal).   See Derm  PLEASE NOTE:  If you had any LAB tests please let us know if you have not heard back within a few days. You may see your results on MyChart before we have a chance to review them but we will give you a call once they are reviewed by [place]. If we ordered any REFERRALS today, please let us know if you have not heard from their office within the next week.  Let us know through MyChart if you are needing REFILLS, or have your pharmacy send [place] the request. You can also use MyChart to communicate with me or any office staff.  Please try these tips to maintain a healthy lifestyle:  Eat most of your calories during the day when you are active. Eliminate processed foods including packaged sweets (pies, cakes, cookies), reduce intake of potatoes, white bread, white pasta, and white rice. Look for whole grain options, oat flour or almond flour.  Each meal should contain half fruits/vegetables, one quarter protein, and one quarter carbs (no bigger than a computer mouse).  Cut down on sweet beverages. This includes juice, soda, and sweet tea. Also watch fruit intake, though this is a healthier sweet option, it still contains natural sugar! Limit to 3 servings daily.  Drink at least 1 glass of water with each meal and aim for at least 8 glasses per day  Exercise at least 150 minutes every week.

## 2021-09-09 ENCOUNTER — Ambulatory Visit (INDEPENDENT_AMBULATORY_CARE_PROVIDER_SITE_OTHER): Payer: BC Managed Care – PPO | Admitting: Podiatry

## 2021-09-09 DIAGNOSIS — M2060 Acquired deformities of toe(s), unspecified, unspecified foot: Secondary | ICD-10-CM

## 2021-09-10 ENCOUNTER — Other Ambulatory Visit: Payer: Self-pay | Admitting: Endocrinology

## 2021-09-12 ENCOUNTER — Encounter: Payer: Self-pay | Admitting: Podiatry

## 2021-09-12 NOTE — Progress Notes (Signed)
  Subjective:  Patient ID: Brandon Andrews, male    DOB: 12-25-1965,  MRN: [id number]  Chief Complaint  Patient presents with   Toe Injury     np 2nd and 3rd toes seperating/ pt thinks plantar plate issues/ but no pain    56 y.o. male presents with the above complaint. History confirmed with patient.  He notes there is a space between the toes.  Does not hurt.  Is worried that they will turn into worsening hammertoes  Objective:  Physical Exam: warm, good capillary refill, no trophic changes or ulcerative lesions, normal DP and PT pulses, normal sensory exam, and good alignment of digits, there is space between each of the digits but nothing to suggest an abnormal deformity, there is no instability no pain on the plantar plate or no neuroma Assessment:   1. Acquired deformity of toe, unspecified laterality      Plan:  Patient was evaluated and treated and all questions answered.  I discussed with him I do not see any evidence of plantar plate injury, neuroma or significant hammertoe deformity.  This could present or develop over time which we discussed.  I do not think there is any particular bracing shoe gear or surgical intervention that I would recommend.  We discussed that it is better to have some space between the toes as opposed to very little space because rubbing on the knuckles causes interdigital corns deformed.  He will return to see [place] as needed.  Return if symptoms worsen or fail to improve.

## 2021-09-17 DIAGNOSIS — L309 Dermatitis, unspecified: Secondary | ICD-10-CM | POA: Diagnosis not present

## 2021-09-17 DIAGNOSIS — L821 Other seborrheic keratosis: Secondary | ICD-10-CM | POA: Diagnosis not present

## 2021-09-29 ENCOUNTER — Encounter: Payer: Self-pay | Admitting: Family Medicine

## 2021-09-29 NOTE — Telephone Encounter (Signed)
Melissa in lab is looking into issue.

## 2021-10-03 ENCOUNTER — Ambulatory Visit (INDEPENDENT_AMBULATORY_CARE_PROVIDER_SITE_OTHER): Payer: BC Managed Care – PPO | Admitting: Family Medicine

## 2021-10-03 ENCOUNTER — Encounter: Payer: Self-pay | Admitting: Family Medicine

## 2021-10-03 VITALS — BP 130/80 | HR 61 | Temp 98.0°F | Ht 70.0 in | Wt 207.2 lb

## 2021-10-03 DIAGNOSIS — R21 Rash and other nonspecific skin eruption: Secondary | ICD-10-CM | POA: Diagnosis not present

## 2021-10-03 DIAGNOSIS — R030 Elevated blood-pressure reading, without diagnosis of hypertension: Secondary | ICD-10-CM

## 2021-10-03 DIAGNOSIS — E230 Hypopituitarism: Secondary | ICD-10-CM

## 2021-10-03 NOTE — Progress Notes (Signed)
   Subjective:     Patient ID: Brandon Andrews, male    DOB: 20-Feb-1966, 56 y.o.   MRN: [id number]  Chief Complaint  Patient presents with   Follow-up    1 week follow-up for weakness Feeling the same, not too much difference     HPI Sweats-did 24 hr urine for 5H1AA- rash-Derm didn't know but will wait till recurs.   Adrenal failure-wonders if from B hip replacements within 1 yr.  Bp 130-140/75-90.  Exercises daily.  Decent diet. Drinks water.  NAS.  Health Maintenance Due  Topic Date Due   HIV Screening  Never done   Hepatitis C Screening  Never done    Past Medical History:  Diagnosis Date   Adrenal failure ([place])    Arthritis    Depression    Hemangioma of liver    Pituitary deficiency [facility])     Past Surgical History:  Procedure Laterality Date   JOINT REPLACEMENT Bilateral 2020   Bilateral hip replacement   SHOULDER SURGERY Bilateral    right, 1985, 2006, left, 2008   VASECTOMY     WRIST SURGERY Left 1991    Outpatient Medications Prior to Visit  Medication Sig Dispense Refill   hydrocortisone (CORTEF) 20 MG tablet TAKE ONE TABLET BY MOUTH EVERY MORNING AND ONE-HALF EVERY SIX IN THE EVENING 135 tablet 1   Testosterone 1.62 % GEL Apply 2 pumps on 1 shoulder and 1 on the other every day after shower 225 g 3   No facility-administered medications prior to visit.    No Known Allergies ROS neg/noncontributory except as noted HPI/below      Objective:     BP 130/80   Pulse 61   Temp 98 F (36.7 C) (Temporal)   Ht '5\' 10"'$  (1.778 m)   Wt 207 lb 4 oz (94 kg)   SpO2 96%   BMI 29.74 kg/m  Wt Readings from Last 3 Encounters:  10/03/21 207 lb 4 oz (94 kg)  09/05/21 203 lb (92.1 kg)  08/15/21 203 lb (92.1 kg)    Physical Exam   Gen: WDWN NAD HEENT: NCAT, conjunctiva not injected, sclera nonicteric NECK:  supple, no thyromegaly, no nodes, no carotid bruits CARDIAC: RRR, S1S2+, no murmur. DP 2+B LUNGS: CTAB. No wheezes EXT:  no edema MSK: no gross  abnormalities.  NEURO: A&O x3.  CN II-XII intact.  PSYCH: normal mood. Good eye contact     Assessment & Plan:   Problem List Items Addressed This Visit       Endocrine   Hypopituitarism ([place]) - Primary   Other Visit Diagnoses     Rash       Elevated blood pressure reading          Hypopit/adrenal failure-on HC.  Care per Endo.  Needs medic bracelet.   Rash-no recurrance yet.  Saw Derm-will see when recurs Elevated bp-working on TLC.  Cont to monitor Sweats-resolved.  5H1AA should be back next wk per lab F/u few mo cpx  No orders of the defined types were placed in this encounter.   Wellington Hampshire, MD

## 2021-10-03 NOTE — Patient Instructions (Addendum)
It was very nice to see you today!  Write down everything eat and drink during day.  See if [facility] has chart review service.   PLEASE NOTE:  If you had any lab tests please let us know if you have not heard back within a few days. You may see your results on MyChart before we have a chance to review them but we will give you a call once they are reviewed by [place]. If we ordered any referrals today, please let us know if you have not heard from their office within the next week.   Please try these tips to maintain a healthy lifestyle:  Eat most of your calories during the day when you are active. Eliminate processed foods including packaged sweets (pies, cakes, cookies), reduce intake of potatoes, white bread, white pasta, and white rice. Look for whole grain options, oat flour or almond flour.  Each meal should contain half fruits/vegetables, one quarter protein, and one quarter carbs (no bigger than a computer mouse).  Cut down on sweet beverages. This includes juice, soda, and sweet tea. Also watch fruit intake, though this is a healthier sweet option, it still contains natural sugar! Limit to 3 servings daily.  Drink at least 1 glass of water with each meal and aim for at least 8 glasses per day  Exercise at least 150 minutes every week.

## 2021-10-04 LAB — 5 HIAA, QUANTITATIVE, URINE, 24 HOUR: Total Volume: 2700 mL

## 2021-10-04 LAB — TIQ-MISC

## 2021-11-28 ENCOUNTER — Telehealth: Payer: Self-pay

## 2021-11-28 ENCOUNTER — Other Ambulatory Visit: Payer: Self-pay | Admitting: Endocrinology

## 2021-11-28 NOTE — Telephone Encounter (Signed)
Patient requesting refill on testosterone.

## 2021-12-02 ENCOUNTER — Telehealth: Payer: Self-pay | Admitting: Family Medicine

## 2021-12-02 NOTE — Telephone Encounter (Signed)
Medication was ordered over the weekend by another provider.  Patient Name: Brandon Andrews Gender: Male DOB: [birth year] Age: 56 Y 43 M 29 D Return Phone Number: [phone] (Primary) Address: City/ State/ Zip: [place] [address] Client [facility] Client Site [place] [facility] [employer] Type Call Who Is Calling Patient / Member / Family / Caregiver Call Type Triage / Clinical Relationship To Patient Self Return Phone Number [phone] (Primary) Chief Complaint Prescription Refill or Medication Request (non symptomatic) Reason for Call Medication Question / Request Initial Comment Caller states he has an expired rx. The pharmacy requested for a renewal. The office has not sent it. He is now out of the rx. The normal pharmacy he uses closes at 1pm Translation No Nurse Assessment Nurse: Loletha Carrow, RN, Ronalee Belts Date/Time (Eastern Time): 11/29/2021 10:26:21 AM Confirm and document reason for call. If symptomatic, describe symptoms. ---Caller states he has an expired rx. The pharmacy requested for a renewal. The office has not sent it. He is now out of the rx. topical testosterone last dose > 1 week no new s/s Does the patient have any new or worsening symptoms? ---No Please document clinical information provided and list any resource used. ---Please sent to same pharmacy as previously sent to. Disp. Time Eilene Ghazi Time) Disposition Final User 11/29/2021 10:30:36 AM Clinical Call Yes Emch, RN, Ronalee Belts Final Disposition 11/29/2021 10:30:36 AM Clinical Call Yes Emch, RN, Ronalee Belts

## 2021-12-02 NOTE — Telephone Encounter (Signed)
Noted  

## 2021-12-08 ENCOUNTER — Ambulatory Visit (INDEPENDENT_AMBULATORY_CARE_PROVIDER_SITE_OTHER): Payer: BC Managed Care – PPO | Admitting: Family Medicine

## 2021-12-08 ENCOUNTER — Encounter: Payer: Self-pay | Admitting: Family Medicine

## 2021-12-08 VITALS — BP 116/80 | HR 44 | Temp 97.5°F | Ht 70.0 in | Wt 196.4 lb

## 2021-12-08 DIAGNOSIS — Z0000 Encounter for general adult medical examination without abnormal findings: Secondary | ICD-10-CM | POA: Diagnosis not present

## 2021-12-08 DIAGNOSIS — Z114 Encounter for screening for human immunodeficiency virus [HIV]: Secondary | ICD-10-CM

## 2021-12-08 DIAGNOSIS — Z125 Encounter for screening for malignant neoplasm of prostate: Secondary | ICD-10-CM | POA: Diagnosis not present

## 2021-12-08 DIAGNOSIS — Z1159 Encounter for screening for other viral diseases: Secondary | ICD-10-CM

## 2021-12-08 DIAGNOSIS — Z23 Encounter for immunization: Secondary | ICD-10-CM | POA: Diagnosis not present

## 2021-12-08 LAB — LIPID PANEL
Cholesterol: 169 mg/dL (ref 0–200)
HDL: 52.6 mg/dL (ref >39.00)
LDL Cholesterol: 95 mg/dL (ref 0–99)
NonHDL: 116.28
Total CHOL/HDL Ratio: 3
Triglycerides: 108 mg/dL (ref 0.0–149.0)
VLDL: 21.6 mg/dL (ref 0.0–40.0)

## 2021-12-08 LAB — CBC WITH DIFFERENTIAL/PLATELET
Basophils Absolute: 0 10*3/uL (ref 0.0–0.1)
Basophils Relative: 0.6 % (ref 0.0–3.0)
Eosinophils Absolute: 0.3 10*3/uL (ref 0.0–0.7)
Eosinophils Relative: 4.7 % (ref 0.0–5.0)
HCT: 44.5 % (ref 39.0–52.0)
Hemoglobin: 15.1 g/dL (ref 13.0–17.0)
Lymphocytes Relative: 47 % — ABNORMAL HIGH (ref 12.0–46.0)
Lymphs Abs: 3.4 10*3/uL (ref 0.7–4.0)
MCHC: 34 g/dL (ref 30.0–36.0)
MCV: 92.9 fl (ref 78.0–100.0)
Monocytes Absolute: 0.4 10*3/uL (ref 0.1–1.0)
Monocytes Relative: 6.1 % (ref 3.0–12.0)
Neutro Abs: 3 10*3/uL (ref 1.4–7.7)
Neutrophils Relative %: 41.6 % — ABNORMAL LOW (ref 43.0–77.0)
Platelets: 274 10*3/uL (ref 150.0–400.0)
RBC: 4.79 Mil/uL (ref 4.22–5.81)
RDW: 13.4 % (ref 11.5–15.5)
WBC: 7.3 10*3/uL (ref 4.0–10.5)

## 2021-12-08 LAB — COMPREHENSIVE METABOLIC PANEL
ALT: 28 U/L (ref 0–53)
AST: 22 U/L (ref 0–37)
Albumin: 4.2 g/dL (ref 3.5–5.2)
Alkaline Phosphatase: 44 U/L (ref 39–117)
BUN: 24 mg/dL — ABNORMAL HIGH (ref 6–23)
CO2: 32 mEq/L (ref 19–32)
Calcium: 9.8 mg/dL (ref 8.4–10.5)
Chloride: 103 mEq/L (ref 96–112)
Creatinine, Ser: 1.11 mg/dL (ref 0.40–1.50)
GFR: 74.23 mL/min (ref >60.00)
Glucose, Bld: 86 mg/dL (ref 70–99)
Potassium: 4.6 mEq/L (ref 3.5–5.1)
Sodium: 143 mEq/L (ref 135–145)
Total Bilirubin: 0.6 mg/dL (ref 0.2–1.2)
Total Protein: 6.6 g/dL (ref 6.0–8.3)

## 2021-12-08 LAB — TSH: TSH: 2.89 u[IU]/mL (ref 0.35–5.50)

## 2021-12-08 LAB — HEMOGLOBIN A1C: Hgb A1c MFr Bld: 5.5 % (ref 4.6–6.5)

## 2021-12-08 LAB — PSA: PSA: 0.63 ng/mL (ref 0.10–4.00)

## 2021-12-08 NOTE — Patient Instructions (Signed)
It was very nice to see you today!  Keep up the good work   PLEASE NOTE:  If you had any lab tests please let us know if you have not heard back within a few days. You may see your results on MyChart before we have a chance to review them but we will give you a call once they are reviewed by [place]. If we ordered any referrals today, please let us know if you have not heard from their office within the next week.   Please try these tips to maintain a healthy lifestyle:  Eat most of your calories during the day when you are active. Eliminate processed foods including packaged sweets (pies, cakes, cookies), reduce intake of potatoes, white bread, white pasta, and white rice. Look for whole grain options, oat flour or almond flour.  Each meal should contain half fruits/vegetables, one quarter protein, and one quarter carbs (no bigger than a computer mouse).  Cut down on sweet beverages. This includes juice, soda, and sweet tea. Also watch fruit intake, though this is a healthier sweet option, it still contains natural sugar! Limit to 3 servings daily.  Drink at least 1 glass of water with each meal and aim for at least 8 glasses per day  Exercise at least 150 minutes every week.

## 2021-12-08 NOTE — Progress Notes (Signed)
Phone: [phone]   Subjective:  Patient 56 y.o. male presenting for annual physical.  Chief Complaint  Patient presents with   Annual Exam    Fasting    Annual-working on exercise/diet.  Pulse always low.  Swims a mile daily, wts, bikes. Was in [employer] in [place]  See problem oriented charting- ROS- ROS: Gen: no fever, chills  Skin: no rash, itching ENT: no ear pain, ear drainage, nasal congestion, rhinorrhea, sinus pressure, sore throat Eyes: no blurry vision, double vision Resp: no cough, wheeze,SOB CV: no CP, palpitations, LE edema,  GI: no heartburn, n/v/d/c, abd pain GU: no dysuria, urgency, frequency, hematuria MSK: no joint pain, myalgias, back pain Neuro: no dizziness, headache, weakness, vertigo Psych: no depression, anxiety, insomnia, SI   Prostate:-declined chaperone-normal prostate. No rectal lesions  The following were reviewed and entered/updated in epic: Past Medical History:  Diagnosis Date   Adrenal failure ([place])    Arthritis    Depression    Hemangioma of liver    Pituitary deficiency [facility])    Patient Active Problem List   Diagnosis Date Noted   Hypopituitarism ([employer]) 04/15/2020   Past Surgical History:  Procedure Laterality Date   JOINT REPLACEMENT Bilateral 2020   Bilateral hip replacement   SHOULDER SURGERY Bilateral    right, 1985, 2006, left, 2008   VASECTOMY     WRIST SURGERY Left 1991    Family History  Problem Relation Age of Onset   Hyperlipidemia Father    Heart disease Father    Hearing loss Father    Hypertension Father    Heart attack Father    Thyroid cancer Sister    Diabetes Paternal Grandmother     Medications- reviewed and updated Current Outpatient Medications  Medication Sig Dispense Refill   hydrocortisone (CORTEF) 20 MG tablet TAKE ONE TABLET BY MOUTH EVERY MORNING AND ONE-HALF EVERY SIX IN THE EVENING 135 tablet 1   Testosterone 1.62 % GEL Apply 2 pumps on 1 shoulder and 1 on the other every  day after shower 225 g 3   No current facility-administered medications for this visit.    Allergies-reviewed and updated No Known Allergies  Social History   Social History Narrative   IT-ops mgr   Objective  Objective:  BP 116/80   Pulse (!) 44   Temp (!) 97.5 F (36.4 C) (Temporal)   Ht '5\' 10"'$  (1.778 m)   Wt 196 lb 6 oz (89.1 kg)   SpO2 97%   BMI 28.18 kg/m  Physical Exam  Gen: WDWN NAD HEENT: NCAT, conjunctiva not injected, sclera nonicteric TM WNL B, OP moist, no exudates  NECK:  supple, no thyromegaly, no nodes, no carotid bruits CARDIAC: brady RRR, S1S2+, no murmur. DP 2+B LUNGS: CTAB. No wheezes ABDOMEN:  BS+, soft, NTND, No HSM, no masses EXT:  no edema MSK: no gross abnormalities. MS 5/5 all 4 NEURO: A&O x3.  CN II-XII intact.  PSYCH: normal mood. Good eye contact     Assessment and Plan   Health Maintenance counseling: 1. Anticipatory guidance: Patient counseled regarding regular dental exams q6 months, eye exams yearly, avoiding smoking and second hand smoke, limiting alcohol to 2 beverages per day.   2. Risk factor reduction:  Advised patient of need for regular exercise and diet rich in fruits and vegetables to reduce risk of heart attack and stroke. Exercise- +.   Wt Readings from Last 3 Encounters:  12/08/21 196 lb 6 oz (89.1 kg)  10/03/21 207 lb 4  oz (94 kg)  09/05/21 203 lb (92.1 kg)   3. Immunizations/screenings/ancillary studies Immunization History  Administered Date(s) Administered   Influenza Inj Mdck Quad Pf 12/14/2018   Influenza Split 02/14/2021   Influenza,inj,Quad PF,6+ Mos 02/19/2018   Influenza,inj,quad, With Preservative 02/19/2018, 12/14/2018   Moderna Sars-Covid-2 Vaccination 06/14/2019, 07/12/2019   Td 12/21/2013   Tdap 08/16/2015   Zoster Recombinat (Shingrix) 01/09/2019, 03/14/2019   Zoster, Live 01/09/2019, 03/14/2019   Health Maintenance Due  Topic Date Due   HIV Screening  Never done   Hepatitis C Screening  Never  done   INFLUENZA VACCINE  10/28/2021    4. Prostate cancer screening >55yo - risk factors? No results found for: "PSA"  5. Colon cancer screening:UTD 6. Skin cancer screening- Iadvised regular sunscreen use. Denies worrisome, changing, or new skin lesions.   Problem List Items Addressed This Visit   None Visit Diagnoses     Wellness examination    -  Primary   Need for influenza vaccination       Relevant Orders   Flu Vaccine QUAD 6+ mos PF IM (Fluarix Quad PF)      Wellness-anticipatory guidance.  Work on [employer].  Check CBC,CMP,lipids,TSH, A1C.  F/u 1 yr   flu given  Recommended follow up: 16mo follow-ups on file. Future Appointments  Date Time Provider [place] 02/23/2022  9:30 AM [facility] None  02/26/2022  3:30 PM KElayne Snare MD [facility] None     Lab/Order associations:+ fasting   ICD-10-CM   1. Wellness examination  Z00.00     2. Need for influenza vaccination  Z23 Flu Vaccine QUAD 6+ mos PF IM (Fluarix Quad PF)      No orders of the defined types were placed in this encounter.    AWellington Hampshire MD

## 2021-12-09 LAB — HEPATITIS C ANTIBODY: Hepatitis C Ab: NONREACTIVE

## 2021-12-09 LAB — HIV ANTIBODY (ROUTINE TESTING W REFLEX): HIV 1&2 Ab, 4th Generation: NONREACTIVE

## 2021-12-10 ENCOUNTER — Encounter: Payer: Self-pay | Admitting: Family Medicine

## 2021-12-10 ENCOUNTER — Encounter: Payer: Self-pay | Admitting: Endocrinology

## 2021-12-10 DIAGNOSIS — R799 Abnormal finding of blood chemistry, unspecified: Secondary | ICD-10-CM

## 2021-12-10 DIAGNOSIS — D729 Disorder of white blood cells, unspecified: Secondary | ICD-10-CM

## 2021-12-11 NOTE — Telephone Encounter (Signed)
Pt is requesting to schedule his follow lab visit. Please add orders and then we can schedule.

## 2021-12-12 ENCOUNTER — Other Ambulatory Visit: Payer: Self-pay | Admitting: Endocrinology

## 2021-12-12 MED ORDER — HYDROCORTISONE 20 MG PO TABS: ORAL_TABLET | ORAL | 1 refills | Status: DC

## 2022-01-07 ENCOUNTER — Other Ambulatory Visit (INDEPENDENT_AMBULATORY_CARE_PROVIDER_SITE_OTHER): Payer: BC Managed Care – PPO

## 2022-01-07 DIAGNOSIS — D729 Disorder of white blood cells, unspecified: Secondary | ICD-10-CM | POA: Diagnosis not present

## 2022-01-07 DIAGNOSIS — E230 Hypopituitarism: Secondary | ICD-10-CM | POA: Diagnosis not present

## 2022-01-07 DIAGNOSIS — R799 Abnormal finding of blood chemistry, unspecified: Secondary | ICD-10-CM

## 2022-01-07 LAB — CBC WITH DIFFERENTIAL/PLATELET
Basophils Absolute: 0 10*3/uL (ref 0.0–0.1)
Basophils Relative: 0.5 % (ref 0.0–3.0)
Eosinophils Absolute: 0.3 10*3/uL (ref 0.0–0.7)
Eosinophils Relative: 4.1 % (ref 0.0–5.0)
HCT: 43.5 % (ref 39.0–52.0)
Hemoglobin: 14.7 g/dL (ref 13.0–17.0)
Lymphocytes Relative: 46.7 % — ABNORMAL HIGH (ref 12.0–46.0)
Lymphs Abs: 3.6 10*3/uL (ref 0.7–4.0)
MCHC: 33.9 g/dL (ref 30.0–36.0)
MCV: 93.6 fl (ref 78.0–100.0)
Monocytes Absolute: 0.5 10*3/uL (ref 0.1–1.0)
Monocytes Relative: 6.8 % (ref 3.0–12.0)
Neutro Abs: 3.3 10*3/uL (ref 1.4–7.7)
Neutrophils Relative %: 41.9 % — ABNORMAL LOW (ref 43.0–77.0)
Platelets: 268 10*3/uL (ref 150.0–400.0)
RBC: 4.64 Mil/uL (ref 4.22–5.81)
RDW: 13.5 % (ref 11.5–15.5)
WBC: 7.8 10*3/uL (ref 4.0–10.5)

## 2022-01-07 LAB — BASIC METABOLIC PANEL
BUN: 23 mg/dL (ref 6–23)
CO2: 27 mEq/L (ref 19–32)
Calcium: 9.5 mg/dL (ref 8.4–10.5)
Chloride: 106 mEq/L (ref 96–112)
Creatinine, Ser: 1.06 mg/dL (ref 0.40–1.50)
GFR: 78.4 mL/min (ref >60.00)
Glucose, Bld: 82 mg/dL (ref 70–99)
Potassium: 4.9 mEq/L (ref 3.5–5.1)
Sodium: 141 mEq/L (ref 135–145)

## 2022-01-07 LAB — TESTOSTERONE: Testosterone: 365.14 ng/dL (ref 300.00–890.00)

## 2022-01-08 ENCOUNTER — Encounter: Payer: Self-pay | Admitting: Family Medicine

## 2022-01-09 NOTE — Progress Notes (Signed)
I am not concerned about these results.  Very close to normal.  Repeat cbcd in 3 months for stability

## 2022-01-12 ENCOUNTER — Other Ambulatory Visit: Payer: Self-pay | Admitting: *Deleted

## 2022-01-12 ENCOUNTER — Encounter: Payer: Self-pay | Admitting: *Deleted

## 2022-01-12 DIAGNOSIS — D729 Disorder of white blood cells, unspecified: Secondary | ICD-10-CM

## 2022-02-02 ENCOUNTER — Other Ambulatory Visit: Payer: Self-pay | Admitting: Endocrinology

## 2022-02-17 ENCOUNTER — Ambulatory Visit: Payer: BC Managed Care – PPO | Admitting: Endocrinology

## 2022-02-23 ENCOUNTER — Other Ambulatory Visit (INDEPENDENT_AMBULATORY_CARE_PROVIDER_SITE_OTHER): Payer: BC Managed Care – PPO

## 2022-02-23 ENCOUNTER — Other Ambulatory Visit: Payer: Self-pay | Admitting: Internal Medicine

## 2022-02-23 DIAGNOSIS — R5383 Other fatigue: Secondary | ICD-10-CM

## 2022-02-23 DIAGNOSIS — E230 Hypopituitarism: Secondary | ICD-10-CM | POA: Diagnosis not present

## 2022-02-23 LAB — CBC
HCT: 46.4 % (ref 39.0–52.0)
Hemoglobin: 15.6 g/dL (ref 13.0–17.0)
MCHC: 33.5 g/dL (ref 30.0–36.0)
MCV: 93.9 fl (ref 78.0–100.0)
Platelets: 314 10*3/uL (ref 150.0–400.0)
RBC: 4.95 Mil/uL (ref 4.22–5.81)
RDW: 13.4 % (ref 11.5–15.5)
WBC: 8.4 10*3/uL (ref 4.0–10.5)

## 2022-02-24 ENCOUNTER — Other Ambulatory Visit: Payer: Self-pay

## 2022-02-24 ENCOUNTER — Encounter: Payer: Self-pay | Admitting: Family Medicine

## 2022-02-24 ENCOUNTER — Other Ambulatory Visit: Payer: Self-pay | Admitting: Endocrinology

## 2022-02-24 ENCOUNTER — Encounter: Payer: Self-pay | Admitting: Endocrinology

## 2022-02-24 ENCOUNTER — Other Ambulatory Visit (INDEPENDENT_AMBULATORY_CARE_PROVIDER_SITE_OTHER): Payer: BC Managed Care – PPO

## 2022-02-24 DIAGNOSIS — E230 Hypopituitarism: Secondary | ICD-10-CM | POA: Diagnosis not present

## 2022-02-24 LAB — BASIC METABOLIC PANEL
BUN: 19 mg/dL (ref 6–23)
CO2: 30 mEq/L (ref 19–32)
Calcium: 9.6 mg/dL (ref 8.4–10.5)
Chloride: 103 mEq/L (ref 96–112)
Creatinine, Ser: 1.06 mg/dL (ref 0.40–1.50)
GFR: 78.33 mL/min (ref >60.00)
Glucose, Bld: 68 mg/dL — ABNORMAL LOW (ref 70–99)
Potassium: 4.4 mEq/L (ref 3.5–5.1)
Sodium: 140 mEq/L (ref 135–145)

## 2022-02-26 ENCOUNTER — Ambulatory Visit: Payer: BC Managed Care – PPO | Admitting: Endocrinology

## 2022-02-27 ENCOUNTER — Encounter: Payer: Self-pay | Admitting: Endocrinology

## 2022-02-27 ENCOUNTER — Ambulatory Visit (INDEPENDENT_AMBULATORY_CARE_PROVIDER_SITE_OTHER): Payer: BC Managed Care – PPO | Admitting: Endocrinology

## 2022-02-27 VITALS — BP 118/76 | HR 72 | Ht 66.0 in | Wt 203.0 lb

## 2022-02-27 DIAGNOSIS — E230 Hypopituitarism: Secondary | ICD-10-CM | POA: Diagnosis not present

## 2022-02-27 NOTE — Progress Notes (Signed)
Patient ID: Brandon Andrews, male   DOB: 1965-04-07, 56 y.o.   MRN: [id number]            Chief complaint: Endocrinology follow-up  History of Present Illness:   History obtained at initial visit on 03/27/2020  He started having multiple symptoms seen in June 2021 Initially was having some headaches which were intermittent Also had symptoms of feeling more tired and weak and feeling exhausted He also had some difficulty with thinking clearly or finding the right words Subsequently he also started losing weight when he was weak and probably lost about 25 pounds He was having hiccups but no symptoms of nausea or vomiting Was having some dizziness at times but not significant lightheadedness or syncope. Also has had some loose stools Additionally he was having problems with pain in all his joints and felt like bone pain Is also complaining of some spasms in his extremities but not cramps In October 21 because of significant joint pain he was seen by a rheumatologist Although his work-up was negative he was given prednisone 10 mg twice daily which he thinks he took for about 3 weeks While taking prednisone he had normalization of his energy level, improved mood, decreased pain and no weakness He was taken off prednisone in early December  RECENT HISTORY:  He has had HYPOPITUITARISM with the following hormone deficiencies  Cortisol deficiency Symptoms of initial weakness, weight loss and decreased appetite as well as blood pressure noted to be low with PCP.  Subsequently labs indicated undetectable cortisol levels even after ACTH stimulation ACTH level undetectable Synthetic glucocorticoid screen was negative  He was started on hydrocortisone 20 mg in the morning and 10 mg in the late afternoon on 03/29/2020 With this he had complete resolution of symptoms including joint pains and weakness/fatigue  Recently complaints of nausea or decreased appetite, no weakness or joint  pains Weight has now leveled off after initial gain  Sodium level has been consistently normal Urine free cortisol on dexamethasone 11/22 was undetectable  TESTOSTERONE deficiency: Although he reportedly had testosterone deficiency for 10 to 15 years and previously treated with AndroGel he only started with symptoms of fatigue since late 2021.  Most of his testosterone levels had been over 300 through PCP office  Since his free testosterone level at baseline was significantly low with normal LH level he has been started on AndroGel 1.62% Has been applying this regularly He was prescribed 1 pump on 1 shoulder and 2 on the other  He is able to do significant amount of exercise without any fatigue or weakness No history of decreased libido Total testosterone is stable over 300  Prolactin level at baseline was increased at 26, subsequently back to normal  Lab Results  Component Value Date   TESTOSTERONE 365.14 01/07/2022   TESTOSTERONE 327.25 08/12/2021   TESTOSTERONE 316 02/10/2021   TESTOSTERONE 459.59 08/09/2020      HYPOTHYROIDISM: Free T4 level 0.41, low, done on 11/14/2019 In August 2021 he had a mild increase in TSH of 5.05 and was started on 25 mcg of levothyroxine without any benefit Subsequently free T4 has been normal without levothyroxine supplement  Free T4 is last normal  Lab Results  Component Value Date   TSH 2.89 12/08/2021   TSH 1.92 02/10/2021   TSH 3.31 05/14/2020   FREET4 0.93 08/12/2021   FREET4 0.88 02/10/2021   FREET4 0.77 08/09/2020    IGF-I normal in 12/21  MRI of pituitary gland on 04/10/2020 showed normal  pituitary gland No headaches  Past Medical History:  Diagnosis Date  . Adrenal failure ([place])   . Arthritis   . Depression   . Hemangioma of liver   . Pituitary deficiency [facility])     Past Surgical History:  Procedure Laterality Date  . JOINT REPLACEMENT Bilateral 2020   Bilateral hip replacement  . SHOULDER SURGERY Bilateral     right, 1985, 2006, left, 2008  . VASECTOMY    . WRIST SURGERY Left 1991    Family History  Problem Relation Age of Onset  . Hyperlipidemia Father   . Heart disease Father   . Hearing loss Father   . Hypertension Father   . Heart attack Father   . Thyroid cancer Sister   . Diabetes Paternal Grandmother     Social History:  reports that he has never smoked. He has never used smokeless tobacco. He reports that he does not drink alcohol and does not use drugs.  Allergies: No Known Allergies  Allergies as of 02/27/2022   No Known Allergies      Medication List        Accurate as of February 27, 2022  3:38 PM. If you have any questions, ask your nurse or doctor.          hydrocortisone 20 MG tablet Commonly known as: CORTEF TAKE ONE TABLET BY MOUTH EVERY MORNING, half tablet every evening. May take double doses for stress situations   Testosterone 1.62 % Gel Apply 2 pumps on 1 shoulder and 1 on the other every day after shower        LABS:  Lab on 02/24/2022  Component Date Value Ref Range Status  . Sodium 02/24/2022 140  135 - 145 mEq/L Final  . Potassium 02/24/2022 4.4  3.5 - 5.1 mEq/L Final  . Chloride 02/24/2022 103  96 - 112 mEq/L Final  . CO2 02/24/2022 30  19 - 32 mEq/L Final  . Glucose, Bld 02/24/2022 68 (L)  70 - 99 mg/dL Final  . BUN 02/24/2022 19  6 - 23 mg/dL Final  . Creatinine, Ser 02/24/2022 1.06  0.40 - 1.50 mg/dL Final  . GFR 02/24/2022 78.33  >60.00 mL/min Final   Calculated using the CKD-EPI Creatinine Equation (2021)  . Calcium 02/24/2022 9.6  8.4 - 10.5 mg/dL Final  Lab on 02/23/2022  Component Date Value Ref Range Status  . WBC 02/23/2022 8.4  4.0 - 10.5 K/uL Final  . RBC 02/23/2022 4.95  4.22 - 5.81 Mil/uL Final  . Platelets 02/23/2022 314.0  150.0 - 400.0 K/uL Final  . Hemoglobin 02/23/2022 15.6  13.0 - 17.0 g/dL Final  . HCT 02/23/2022 46.4  39.0 - 52.0 % Final  . MCV 02/23/2022 93.9  78.0 - 100.0 fl Final  . MCHC 02/23/2022 33.5   30.0 - 36.0 g/dL Final  . RDW 02/23/2022 13.4  11.5 - 15.5 % Final  . Testosterone, Serum (Total) 02/23/2022 394  ng/dL Final   Comment: This test was developed and its performance characteristics determined by [facility]. It has not been cleared or approved by the Food and Drug Administration. Reference Range: Adult Males >18 years    46 - 70 This [facility] LC/MS-MS method is currently certified by the [facility] Hormone Standardization Program (HoST).  Adult male reference interval is based on a population of healthy nonobese males (BMI <30) between 13 and 34 years old. Maximiano Coss 0355,974;1638-4536 PMID: [id number].   . % Free Testosterone 02/23/2022 WILL FOLLOW   Preliminary  .  Free Testosterone, S 02/23/2022 WILL FOLLOW   Preliminary  . Sex Hormone Binding Globulin 02/23/2022 37.6  nmol/L Final   Comment: Reference Range: >49y: 19.3 - 76.4         Review of Systems  Today he is asking about episodes of what he thinks is stress He periodically will have a sensation of feeling warm or flushed especially in his face and head area along with tender skin breakouts which can occur either in his extremities or head and neck area.  He also appears to have weakness at that time but no sweating At that time he also appears to have feeling of rapid heartbeat and headache  When he feels the symptoms he also feels some alteration in his mood and difficulty with mental focus he  BLOOD PRESSURE   BP Readings from Last 3 Encounters:  02/27/22 118/76  12/08/21 116/80  10/03/21 130/80    Weight history he thinks he has difficulty losing weight despite working out aggressively and usually having around 2000-calorie intake a day  Wt Readings from Last 3 Encounters:  02/27/22 203 lb (92.1 kg)  12/08/21 196 lb 6 oz (89.1 kg)  10/03/21 207 lb 4 oz (94 kg)    He has had episode of COVID, otherwise has had cold/viral illnesses which he thinks are unusual and lasting longer than  usual  PHYSICAL EXAM:  BP 118/76 (Patient Position: Sitting)   Pulse 72   Ht '5\' 6"'$  (1.676 m)   Wt 203 lb (92.1 kg)   SpO2 96%   BMI 32.77 kg/m   Does not look cushingoid  ASSESSMENT:   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 He is on full therapeutic doses of hydrocortisone, has no further symptoms, previously had baseline marked fatigue and arthralgia  Subjectively doing very well as above with no clinical signs or symptoms of adrenal insufficiency He has taken his hydrocortisone consistently   Difficult with weight loss not explained from endocrine standpoint  Testosterone deficiency is adequately placed and he is not symptomatic now  Unusual episodes of skin rash, decreased mental ability, some palpitations and headaches of unclear etiology.  However because of his blood pressure being relatively higher may need to rule out  PLAN:   Continue hydrocortisone 20 mg in the morning and 10 mg around 5 PM as before May reassess endogenous cortisol production on his next visit in 6 months Continue testosterone gel at the same dose  24 urine catecholamines   Elayne Snare 02/27/2022, 3:38 PM

## 2022-02-28 LAB — TESTOSTERONE, FREE AND TOTAL (INCLUDES SHBG)-(MALES)
% Free Testosterone: 1.6 %
Free Testosterone, S: 63 pg/mL
Sex Hormone Binding Globulin: 37.6 nmol/L
Testosterone, Serum (Total): 394 ng/dL

## 2022-02-28 MED ORDER — DEXAMETHASONE 0.75 MG PO TABS: 0.7500 mg | ORAL_TABLET | Freq: Every day | ORAL | 0 refills | Status: DC

## 2022-03-03 ENCOUNTER — Other Ambulatory Visit: Payer: Self-pay | Admitting: *Deleted

## 2022-03-03 MED ORDER — BUPROPION HCL ER (XL) 150 MG PO TB24: 150.0000 mg | ORAL_TABLET | Freq: Every day | ORAL | 2 refills | Status: DC

## 2022-03-18 DIAGNOSIS — D3131 Benign neoplasm of right choroid: Secondary | ICD-10-CM | POA: Diagnosis not present

## 2022-03-26 ENCOUNTER — Other Ambulatory Visit: Payer: Self-pay | Admitting: Endocrinology

## 2022-03-26 NOTE — Telephone Encounter (Signed)
Patient needs refill on testosterone medication.

## 2022-03-27 ENCOUNTER — Other Ambulatory Visit: Payer: BC Managed Care – PPO

## 2022-03-27 DIAGNOSIS — E230 Hypopituitarism: Secondary | ICD-10-CM

## 2022-03-27 DIAGNOSIS — D729 Disorder of white blood cells, unspecified: Secondary | ICD-10-CM

## 2022-03-27 NOTE — Progress Notes (Unsigned)
Start date:03/26/2022 End Date:03/27/2022 Total volume: 2200 mL

## 2022-03-28 LAB — CREATININE, URINE, 24 HOUR
Creatinine, 24H Ur: 1566 mg/24 hr (ref 1000–2000)
Creatinine, Urine: 71.2 mg/dL

## 2022-04-03 LAB — CORTISOL, URINE, FREE
Cortisol (Ur), Free: <2 ug/24 hr — ABNORMAL LOW (ref 5–64)
Cortisol,F,ug/L,U: <1 ug/L

## 2022-04-09 ENCOUNTER — Other Ambulatory Visit: Payer: Self-pay

## 2022-04-22 ENCOUNTER — Other Ambulatory Visit: Payer: Self-pay | Admitting: Endocrinology

## 2022-05-01 ENCOUNTER — Other Ambulatory Visit: Payer: Self-pay | Admitting: Family Medicine

## 2022-05-13 ENCOUNTER — Encounter: Payer: Self-pay | Admitting: Endocrinology

## 2022-05-16 ENCOUNTER — Other Ambulatory Visit: Payer: Self-pay | Admitting: Endocrinology

## 2022-05-19 ENCOUNTER — Encounter: Payer: Self-pay | Admitting: Endocrinology

## 2022-06-17 DIAGNOSIS — M19012 Primary osteoarthritis, left shoulder: Secondary | ICD-10-CM | POA: Diagnosis not present

## 2022-08-03 ENCOUNTER — Other Ambulatory Visit: Payer: Self-pay | Admitting: Endocrinology

## 2022-08-03 ENCOUNTER — Other Ambulatory Visit (INDEPENDENT_AMBULATORY_CARE_PROVIDER_SITE_OTHER): Payer: BC Managed Care – PPO

## 2022-08-03 DIAGNOSIS — E230 Hypopituitarism: Secondary | ICD-10-CM

## 2022-08-03 LAB — BASIC METABOLIC PANEL
BUN: 21 mg/dL (ref 6–23)
CO2: 28 mEq/L (ref 19–32)
Calcium: 9.1 mg/dL (ref 8.4–10.5)
Chloride: 104 mEq/L (ref 96–112)
Creatinine, Ser: 1.04 mg/dL (ref 0.40–1.50)
GFR: 79.89 mL/min (ref >60.00)
Glucose, Bld: 81 mg/dL (ref 70–99)
Potassium: 4.1 mEq/L (ref 3.5–5.1)
Sodium: 139 mEq/L (ref 135–145)

## 2022-08-03 LAB — CBC
HCT: 43.5 % (ref 39.0–52.0)
Hemoglobin: 15.1 g/dL (ref 13.0–17.0)
MCHC: 34.7 g/dL (ref 30.0–36.0)
MCV: 92.3 fl (ref 78.0–100.0)
Platelets: 292 10*3/uL (ref 150.0–400.0)
RBC: 4.71 Mil/uL (ref 4.22–5.81)
RDW: 13.2 % (ref 11.5–15.5)
WBC: 7.6 10*3/uL (ref 4.0–10.5)

## 2022-08-03 LAB — TESTOSTERONE: Testosterone: 246 ng/dL — ABNORMAL LOW (ref 300.00–890.00)

## 2022-08-04 ENCOUNTER — Encounter: Payer: Self-pay | Admitting: Endocrinology

## 2022-08-05 NOTE — Telephone Encounter (Signed)
Can you assist with getting this patient scheduled?

## 2022-08-06 ENCOUNTER — Ambulatory Visit: Payer: BC Managed Care – PPO | Admitting: Endocrinology

## 2022-08-07 ENCOUNTER — Ambulatory Visit: Payer: BC Managed Care – PPO | Admitting: Endocrinology

## 2022-08-11 NOTE — Telephone Encounter (Signed)
Message left on cell phone voice mail to call office to reschedule canceled appointments.

## 2022-08-13 NOTE — Progress Notes (Signed)
Patient ID: Brandon Andrews, male   DOB: 07/19/1965, 57 y.o.   MRN: [id number]            Chief complaint: Endocrinology follow-up  History of Present Illness:   History obtained at initial visit on 03/27/2020  He started having multiple symptoms seen in June 2021 Initially was having some headaches which were intermittent Also had symptoms of feeling more tired and weak and feeling exhausted He also had some difficulty with thinking clearly or finding the right words Subsequently he also started losing weight when he was weak and probably lost about 25 pounds He was having hiccups but no symptoms of nausea or vomiting Was having some dizziness at times but not significant lightheadedness or syncope. Also has had some loose stools Additionally he was having problems with pain in all his joints and felt like bone pain Is also complaining of some spasms in his extremities but not cramps In October 21 because of significant joint pain he was seen by a rheumatologist Although his work-up was negative he was given prednisone 10 mg twice daily which he thinks he took for about 3 weeks While taking prednisone he had normalization of his energy level, improved mood, decreased pain and no weakness He was taken off prednisone in early December  RECENT HISTORY:  He has had HYPOPITUITARISM with the following hormone deficiencies  Cortisol deficiency Symptoms of initial weakness, weight loss and decreased appetite as well as blood pressure noted to be low with PCP.  Subsequently labs indicated undetectable cortisol levels even after ACTH stimulation ACTH level undetectable Synthetic glucocorticoid screen was negative  He was started on hydrocortisone 20 mg in the morning and 10 mg in the afternoon on 03/29/2020 With this he had complete resolution of symptoms including joint pains, weakness/fatigue  No complaints of lightheadedness or weakness, normal appetite and no significant weight  change He takes hydrocortisone regularly and also will take extra for stress situations  Sodium level has been consistently normal Urine free cortisol on dexamethasone in 03/2021 was undetectable  TESTOSTERONE deficiency: Although he reportedly had testosterone deficiency for 10 to 15 years and previously treated with AndroGel he only had symptoms of fatigue since late 2021.  Most of his testosterone levels had been over 300 through PCP office  Since his free testosterone level at baseline was significantly low with normal LH level he has been on AndroGel 1.62%  He was prescribed 1 pump on 1 shoulder and 2 on the other Has been applying this regularly every morning  He still has a high level of exercise with good stamina but he says that he has difficulty in the recovery period No history of decreased libido Total testosterone is now 246  Prolactin level at baseline was increased at 26, subsequently back to normal  Lab Results  Component Value Date   TESTOSTERONE 246.00 (L) 08/03/2022   TESTOSTERONE 365.14 01/07/2022   TESTOSTERONE 327.25 08/12/2021   TESTOSTERONE 316 02/10/2021      THYROID: Free T4 level 0.41, low, done on 11/14/2019 In August 2021 he had a mild increase in TSH of 5.05 and was started on 25 mcg of levothyroxine without any benefit Subsequently free T4 has been normal without levothyroxine supplement  Free T4 is last normal  Lab Results  Component Value Date   TSH 2.89 12/08/2021   TSH 1.92 02/10/2021   TSH 3.31 05/14/2020   FREET4 0.93 08/12/2021   FREET4 0.88 02/10/2021   FREET4 0.77 08/09/2020  IGF-I normal in 12/21  MRI of pituitary gland on 04/10/2020 showed normal pituitary gland No headaches  Past Medical History:  Diagnosis Date   Adrenal failure ([facility])    Arthritis    Depression    Hemangioma of liver    Pituitary deficiency [facility])     Past Surgical History:  Procedure Laterality Date   JOINT REPLACEMENT Bilateral 2020   Bilateral  hip replacement   SHOULDER SURGERY Bilateral    right, 1985, 2006, left, 2008   VASECTOMY     WRIST SURGERY Left 1991    Family History  Problem Relation Age of Onset   Hyperlipidemia Father    Heart disease Father    Hearing loss Father    Hypertension Father    Heart attack Father    Thyroid cancer Sister    Diabetes Paternal Grandmother     Social History:  reports that he has never smoked. He has never used smokeless tobacco. He reports that he does not drink alcohol and does not use drugs.  Allergies: No Known Allergies  Allergies as of 08/14/2022   No Known Allergies      Medication List        Accurate as of Aug 14, 2022  8:39 AM. If you have any questions, ask your nurse or doctor.          buPROPion 150 MG 24 hr tablet Commonly known as: WELLBUTRIN XL Take 1 tablet (150 mg total) by mouth daily.   hydrocortisone 20 MG tablet Commonly known as: CORTEF TAKE ONE TABLET BY MOUTH EVERY MORNING ONE-HALF TABLET EVERY evening. MAY take double doses FOR stress situations   Testosterone 1.62 % Gel APPLY TWO pumps on 1 shoulder and 1 on the other EVERY DAY        LABS:  No visits with results within 1 Week(s) from this visit.  Latest known visit with results is:  Lab on 08/03/2022  Component Date Value Ref Range Status   WBC 08/03/2022 7.6  4.0 - 10.5 K/uL Final   RBC 08/03/2022 4.71  4.22 - 5.81 Mil/uL Final   Platelets 08/03/2022 292.0  150.0 - 400.0 K/uL Final   Hemoglobin 08/03/2022 15.1  13.0 - 17.0 g/dL Final   HCT 09/81/1914 43.5  39.0 - 52.0 % Final   MCV 08/03/2022 92.3  78.0 - 100.0 fl Final   MCHC 08/03/2022 34.7  30.0 - 36.0 g/dL Final   RDW 78/29/5621 13.2  11.5 - 15.5 % Final   Testosterone 08/03/2022 246.00 (L)  300.00 - 890.00 ng/dL Final   Sodium 30/86/5784 139  135 - 145 mEq/L Final   Potassium 08/03/2022 4.1  3.5 - 5.1 mEq/L Final   Chloride 08/03/2022 104  96 - 112 mEq/L Final   CO2 08/03/2022 28  19 - 32 mEq/L Final   Glucose,  Bld 08/03/2022 81  70 - 99 mg/dL Final   BUN 69/62/9528 21  6 - 23 mg/dL Final   Creatinine, Ser 08/03/2022 1.04  0.40 - 1.50 mg/dL Final   GFR 41/32/4401 79.89  >60.00 mL/min Final   Calculated using the CKD-EPI Creatinine Equation (2021)   Calcium 08/03/2022 9.1  8.4 - 10.5 mg/dL Final        Review of Systems    BLOOD PRESSURE history:  BP Readings from Last 3 Encounters:  08/14/22 130/78  02/27/22 118/76  12/08/21 116/80   Has been able to maintain his weight with vigorous exercise including weights  Wt Readings from Last 3  Encounters:  08/14/22 191 lb 6.4 oz (86.8 kg)  02/27/22 203 lb (92.1 kg)  12/08/21 196 lb 6 oz (89.1 kg)    He has had episode of COVID, otherwise has had cold/viral illnesses which he thinks are unusual and lasting longer than usual  PHYSICAL EXAM:  BP 130/78 (Patient Position: Standing)   Pulse (!) 57   Resp 16   Ht 5\' 6"  (1.676 m)   Wt 191 lb 6.4 oz (86.8 kg)   SpO2 98%   BMI 30.89 kg/m   No cushingoid appearance  ASSESSMENT:   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 He is on full therapeutic doses of hydrocortisone with resolution of baseline presenting symptoms of marked fatigue and arthralgia  He is still doing very well with no recurrence of above symptoms No orthostatic drop in blood pressure today He has taken his hydrocortisone regularly as directed including stress doses  Testosterone deficiency is being treated with 3 pumps daily on the AndroGel but he says he is having a little less stamina for exercise although otherwise no fatigue  Testosterone level is below 300 now  No history of other pituitary hormone deficiencies  PLAN:   Continue hydrocortisone 20 mg in the morning and 10 mg around 5 PM as before 2 pumps on each arm on the testosterone gel 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

## 2022-08-14 ENCOUNTER — Encounter: Payer: Self-pay | Admitting: Endocrinology

## 2022-08-14 ENCOUNTER — Ambulatory Visit (INDEPENDENT_AMBULATORY_CARE_PROVIDER_SITE_OTHER): Payer: BC Managed Care – PPO | Admitting: Endocrinology

## 2022-08-14 VITALS — BP 130/78 | HR 57 | Resp 16 | Ht 66.0 in | Wt 191.4 lb

## 2022-08-14 DIAGNOSIS — E230 Hypopituitarism: Secondary | ICD-10-CM | POA: Diagnosis not present

## 2022-08-14 MED ORDER — JATENZO 198 MG PO CAPS: ORAL_CAPSULE | ORAL | 3 refills | Status: DC

## 2022-10-12 ENCOUNTER — Encounter: Payer: Self-pay | Admitting: Family Medicine

## 2022-10-12 ENCOUNTER — Encounter: Payer: Self-pay | Admitting: Endocrinology

## 2022-10-16 ENCOUNTER — Other Ambulatory Visit: Payer: BC Managed Care – PPO

## 2022-10-16 DIAGNOSIS — M9903 Segmental and somatic dysfunction of lumbar region: Secondary | ICD-10-CM | POA: Diagnosis not present

## 2022-10-16 DIAGNOSIS — M9901 Segmental and somatic dysfunction of cervical region: Secondary | ICD-10-CM | POA: Diagnosis not present

## 2022-10-16 DIAGNOSIS — Z4732 Aftercare following explantation of hip joint prosthesis: Secondary | ICD-10-CM | POA: Diagnosis not present

## 2022-10-16 DIAGNOSIS — Z96643 Presence of artificial hip joint, bilateral: Secondary | ICD-10-CM | POA: Diagnosis not present

## 2022-10-22 ENCOUNTER — Other Ambulatory Visit: Payer: BC Managed Care – PPO

## 2022-10-22 ENCOUNTER — Encounter: Payer: Self-pay | Admitting: Family Medicine

## 2022-10-22 ENCOUNTER — Telehealth: Payer: Self-pay | Admitting: Family Medicine

## 2022-10-22 ENCOUNTER — Ambulatory Visit (INDEPENDENT_AMBULATORY_CARE_PROVIDER_SITE_OTHER): Payer: BC Managed Care – PPO | Admitting: Family Medicine

## 2022-10-22 VITALS — BP 93/56 | HR 59 | Temp 97.6°F | Ht 70.0 in | Wt 181.0 lb

## 2022-10-22 DIAGNOSIS — K529 Noninfective gastroenteritis and colitis, unspecified: Secondary | ICD-10-CM

## 2022-10-22 LAB — POC COVID19 BINAXNOW: SARS Coronavirus 2 Ag: NEGATIVE

## 2022-10-22 LAB — GLUCOSE, POCT (MANUAL RESULT ENTRY): POC Glucose: 96 mg/dl (ref 70–99)

## 2022-10-22 MED ORDER — ONDANSETRON 8 MG PO TBDP
8.0000 mg | ORAL_TABLET | Freq: Three times a day (TID) | ORAL | 0 refills | Status: DC | PRN
Start: 2022-10-22 — End: 2022-12-14

## 2022-10-22 NOTE — Telephone Encounter (Signed)
FYI: This call has been transferred to triage nurse: Access Nurse. Once the result note has been entered staff can address the message at that time.  Patient called in with the following symptoms:  Red Word: Nausea,Vomiting, Diarrhea, Fever, and headache   Please advise at Mobile [phone] (mobile)  Message is routed to Provider Pool.

## 2022-10-22 NOTE — Progress Notes (Signed)
Acute Office Visit  Subjective:     Patient ID: Brandon Andrews, male    DOB: Oct 20, 1965, 57 y.o.   MRN: [id number]  Chief Complaint  Patient presents with   Nausea   Fever    Patient is in today for GI symptoms.  Discussed the use of AI scribe software for clinical note transcription with the patient, who gave verbal consent to proceed.  History of Present Illness   The patient, with a known history of adrenal failure, presented with an acute onset of severe nausea, vomiting, and diarrhea that started the previous night. The symptoms were sudden and severe, causing the patient to get up approximately twenty times during the night. The patient also reported a headache and a feeling of being 'cloudy' or in 'slow motion,' which was unusual for him. The headache improved slightly with hydration but was still present.  The patient also reported a fever of 101-102 degrees Fahrenheit, body aches, and generalized weakness. Despite drinking approximately two quarts of water overnight, the patient woke up with a very dry mouth. The patient denied any recent changes in diet or eating at any new restaurants.  The patient's family had recent illnesses, with the patient's daughter having a stomach virus the previous week and the patient's wife having a similar illness two weeks prior. However, the patient reported that his symptoms were more severe than his family members'.  The patient had tried taking Pepto-Bismol, which seemed to slow down the symptoms, and had not had an episode of vomiting or diarrhea since early morning. The patient had been drinking fluids throughout the day. The patient denied any abdominal pain, just a queasy feeling. The patient also denied seeing any blood in his vomit or stool.  The patient had taken a COVID-19 test at home a week prior, which was negative. The patient's blood pressure was noted to be low, and he was feeling dehydrated despite his efforts to drink water.  The patient had not taken any medications other than Tylenol and Pepto-Bismol for his symptoms.   He called triage with his symptoms earlier this morning and was instructed to go to the ED, but he really wants to avoid an ED visit if possible. His wife drove him today and is waiting in the lobby.         All review of systems negative except what is listed in the HPI      Objective:    BP (!) 93/56   Pulse (!) 59   Temp 97.6 F (36.4 C) (Oral)   Ht 5\' 10"  (1.778 m)   Wt 181 lb (82.1 kg)   SpO2 100%   BMI 25.97 kg/m     Physical Exam Vitals reviewed.  Constitutional:      General: He is not in acute distress.    Appearance: Normal appearance. He is ill-appearing.  Cardiovascular:     Rate and Rhythm: Normal rate and regular rhythm.     Pulses: Normal pulses.     Heart sounds: Normal heart sounds.  Pulmonary:     Effort: Pulmonary effort is normal.     Breath sounds: Normal breath sounds.  Abdominal:     General: Abdomen is flat. Bowel sounds are normal. There is no distension.     Palpations: Abdomen is soft. There is no mass.     Tenderness: There is no abdominal tenderness. There is no guarding or rebound.  Skin:    General: Skin is warm and dry.  Neurological:     Mental Status: He is alert and oriented to person, place, and time.  Psychiatric:        Mood and Affect: Mood normal.        Behavior: Behavior normal.        Thought Content: Thought content normal.        Judgment: Judgment normal.     Results for orders placed or performed in visit on 10/22/22  POC COVID-19 BinaxNow  Result Value Ref Range   SARS Coronavirus 2 Ag Negative Negative  POCT Glucose (CBG)  Result Value Ref Range   POC Glucose 96 70 - 99 mg/dl        Assessment & Plan:   Problem List Items Addressed This Visit   None Visit Diagnoses     Gastroenteritis    -  Primary   Relevant Medications   ondansetron (ZOFRAN-ODT) 8 MG disintegrating tablet   Other Relevant Orders    Basic metabolic panel   CBC with Differential/Platelet   POC COVID-19 BinaxNow (Completed)   POCT Glucose (CBG) (Completed)         Acute Gastroenteritis: Sudden onset of nausea, vomiting, diarrhea, and fever. No blood in vomit or stool. No recent travel or unusual food intake. Family members recently had similar symptoms, though less severe. Patient has been able to keep down fluids since early this morning and is feeling slightly better compared to during the night. -COVID test: negative  -Hypotension: minimal improvement at recheck in office.  -Advise to continue hydration and use of over-the-counter remedies like Pepto-Bismol as needed. -Patient prefers to avoid the ED if possible. Adding Zofran and recommend pushing fluids as much as tolerated. Patient aware of signs/symptoms requiring further/urgent evaluation.  -CBC/BMP today  Hx of Adrenal Insufficiency: Patient reports dry mouth despite drinking large amounts of water, and feeling "cloudy" and weak.  -Monitor closely due to potential for adrenal crisis triggered by acute illness. Patient aware of signs/symptoms requiring further/urgent evaluation.  -CBG in office: 96 -checking electrolytes today -Strict ED precautions discussed      Meds ordered this encounter  Medications   ondansetron (ZOFRAN-ODT) 8 MG disintegrating tablet    Sig: Take 1 tablet (8 mg total) by mouth every 8 (eight) hours as needed for nausea or vomiting.    Dispense:  20 tablet    Refill:  0    Order Specific Question:   Supervising Provider    Answer:   Danise Edge A [[id number]]    Return if symptoms worsen or fail to improve.  Clayborne Dana, NP

## 2022-10-22 NOTE — Telephone Encounter (Signed)
Please see message below

## 2022-10-22 NOTE — Telephone Encounter (Signed)
Caller is Vera, Chief Technology Officer, who stated final outcome was for pt to go to ED. Pt refused. Spoke with Malachi Carl since PCP was with patient. She stated patient could be scheduled with another provider today. Since no openings in PCP office, pt has been scheduled with Hyman Hopes @ 4pm today, 7/25.   Patient Name First: Brandon Last: Andrews Gender: Male DOB: [birth year] Age: 57 Y 4 M 21 D Return Phone Number: [phone] (Primary), [phone] (Secondary) Address: City/ State/ Zip: [place] [address] Client [facility] - [facility], [facility] Provider Ruthine Dose, Ann Contact Type Call Who Is Calling Patient / Member / Family / Caregiver Call Type Triage / Clinical Caller Name Dinnis Rog Relationship To Patient Spouse Return Phone Number [phone] (Primary) Chief Complaint Vomiting Reason for Call Symptomatic / Request for Health Information Initial Comment Caller states her husband is experiencing nausea, vomiting, diarrhea and fever of 101.3. There is no blood in the vomit and he has uriniated in the last 8 hours. He is also saying he has a very dry mouth. Translation No Nurse Assessment Nurse: Stefano Gaul, RN, Dwana Curd Date/Time (Eastern Time): 10/22/2022 10:24:12 AM Confirm and document reason for call. If symptomatic, describe symptoms. ---Caller states spouse has nausea, vomiting and diarrhea. Has a severe headache. Temp 101.3. Symptoms started last night. Vomited x 4. Has had 10 diarrhea stools. abd pain is intermittent. has urinated. Does the patient have any new or worsening symptoms? ---Yes Will a triage be completed? ---Yes Related visit to physician within the last 2 weeks? ---No Does the PT have any chronic conditions? (i.e. diabetes, asthma, this includes High risk factors for pregnancy, etc.) ---Yes List chronic conditions. ---adrenal failure Is this a behavioral health or  substance abuse call? ---No Guidelines Guideline Title Affirmed Question Affirmed Notes Nurse Date/Time Lamount Cohen Time) Headache [1] SEVERE headache (e.g., excruciating) AND Stefano Gaul, RN, Dwana Curd 10/22/2022 10:28:53 AM Guidelines Guideline Title Affirmed Question Affirmed Notes Nurse Date/Time Lamount Cohen Time) [2] "worst headache" of life Disp. Time Lamount Cohen Time) Disposition Final User 10/22/2022 10:35:28 AM Go to ED Now (or PCP triage) Yes Stefano Gaul, RN, Dwana Curd Final Disposition 10/22/2022 10:35:28 AM Go to ED Now (or PCP triage) Yes Stefano Gaul, RN, Clerance Lav Disagree/Comply Disagree Caller Understands Yes PreDisposition Home Care Care Advice Given Per Guideline GO TO ED NOW (OR PCP TRIAGE): * IF NO PCP (PRIMARY CARE PROVIDER) SECOND-LEVEL TRIAGE: You need to be seen within the next hour. Go to the ED/UCC at _____________ Hospital. Leave as soon as you can. ANOTHER ADULT SHOULD DRIVE: * It is better and safer if another adult drives instead of you. CARE ADVICE given per Headache (Adult) guideline. Comments User: Art Buff, RN Date/Time Lamount Cohen Time): 10/22/2022 10:33:52 AM pt does not want to go to the ER or urgent care but wants to make appt User: Art Buff, RN Date/Time Lamount Cohen Time): 10/22/2022 10:37:32 AM Called back line at office and spoke to [place] and gave report that pt has fever, vomiting, diarrhea and a severe headache. Triage outcome go to ER now (or PCP triage) but pt does not want to go and wants to make appt Referrals GO TO FACILITY REFUSED

## 2022-10-27 ENCOUNTER — Other Ambulatory Visit: Payer: Self-pay | Admitting: Endocrinology

## 2022-10-27 ENCOUNTER — Other Ambulatory Visit: Payer: Self-pay | Admitting: Family Medicine

## 2022-10-27 ENCOUNTER — Telehealth: Payer: Self-pay | Admitting: Endocrinology

## 2022-10-27 DIAGNOSIS — E230 Hypopituitarism: Secondary | ICD-10-CM

## 2022-10-27 DIAGNOSIS — R5383 Other fatigue: Secondary | ICD-10-CM

## 2022-10-27 NOTE — Telephone Encounter (Signed)
Patient asking to have all labs ordered by Dr. Lucianne Muss  for Thursdays appointment. He advised that the las labs he had done were not ordered when he came for his appointmnet.

## 2022-10-27 NOTE — Telephone Encounter (Signed)
Please Advise on lab orders

## 2022-10-29 ENCOUNTER — Other Ambulatory Visit: Payer: BC Managed Care – PPO

## 2022-10-29 ENCOUNTER — Other Ambulatory Visit (INDEPENDENT_AMBULATORY_CARE_PROVIDER_SITE_OTHER): Payer: BC Managed Care – PPO

## 2022-10-29 DIAGNOSIS — E230 Hypopituitarism: Secondary | ICD-10-CM

## 2022-10-29 DIAGNOSIS — R5383 Other fatigue: Secondary | ICD-10-CM | POA: Diagnosis not present

## 2022-10-29 LAB — CBC
HCT: 42.4 % (ref 39.0–52.0)
Hemoglobin: 13.8 g/dL (ref 13.0–17.0)
MCHC: 32.6 g/dL (ref 30.0–36.0)
MCV: 93.7 fl (ref 78.0–100.0)
Platelets: 481 10*3/uL — ABNORMAL HIGH (ref 150.0–400.0)
RBC: 4.52 Mil/uL (ref 4.22–5.81)
RDW: 13.1 % (ref 11.5–15.5)
WBC: 14.7 10*3/uL — ABNORMAL HIGH (ref 4.0–10.5)

## 2022-10-29 LAB — COMPREHENSIVE METABOLIC PANEL
ALT: 27 U/L (ref 0–53)
AST: 18 U/L (ref 0–37)
Albumin: 3.8 g/dL (ref 3.5–5.2)
Alkaline Phosphatase: 46 U/L (ref 39–117)
BUN: 15 mg/dL (ref 6–23)
CO2: 32 mEq/L (ref 19–32)
Calcium: 9.6 mg/dL (ref 8.4–10.5)
Chloride: 103 mEq/L (ref 96–112)
Creatinine, Ser: 1.04 mg/dL (ref 0.40–1.50)
GFR: 79.76 mL/min (ref >60.00)
Glucose, Bld: 77 mg/dL (ref 70–99)
Potassium: 3.9 mEq/L (ref 3.5–5.1)
Sodium: 140 mEq/L (ref 135–145)
Total Bilirubin: 0.4 mg/dL (ref 0.2–1.2)
Total Protein: 6.3 g/dL (ref 6.0–8.3)

## 2022-10-29 LAB — TESTOSTERONE: Testosterone: 241.97 ng/dL — ABNORMAL LOW (ref 300.00–890.00)

## 2022-11-04 ENCOUNTER — Other Ambulatory Visit: Payer: Self-pay | Admitting: Endocrinology

## 2022-11-04 DIAGNOSIS — E230 Hypopituitarism: Secondary | ICD-10-CM

## 2022-11-05 ENCOUNTER — Other Ambulatory Visit (INDEPENDENT_AMBULATORY_CARE_PROVIDER_SITE_OTHER): Payer: BC Managed Care – PPO

## 2022-11-05 DIAGNOSIS — E230 Hypopituitarism: Secondary | ICD-10-CM

## 2022-11-05 LAB — TESTOSTERONE: Testosterone: 430.32 ng/dL (ref 300.00–890.00)

## 2022-11-09 ENCOUNTER — Other Ambulatory Visit: Payer: Self-pay | Admitting: Endocrinology

## 2022-11-09 ENCOUNTER — Other Ambulatory Visit: Payer: Self-pay

## 2022-11-09 ENCOUNTER — Telehealth: Payer: Self-pay

## 2022-11-09 NOTE — Telephone Encounter (Signed)
Brandon Andrews is asking for refills on his Jatenxo 198 mg caps

## 2022-11-11 ENCOUNTER — Other Ambulatory Visit: Payer: Self-pay | Admitting: Endocrinology

## 2022-11-11 MED ORDER — JATENZO 198 MG PO CAPS: ORAL_CAPSULE | ORAL | 3 refills | Status: DC

## 2022-11-13 DIAGNOSIS — Z4732 Aftercare following explantation of hip joint prosthesis: Secondary | ICD-10-CM | POA: Diagnosis not present

## 2022-11-13 DIAGNOSIS — M9901 Segmental and somatic dysfunction of cervical region: Secondary | ICD-10-CM | POA: Diagnosis not present

## 2022-11-13 DIAGNOSIS — Z96643 Presence of artificial hip joint, bilateral: Secondary | ICD-10-CM | POA: Diagnosis not present

## 2022-11-13 DIAGNOSIS — M9903 Segmental and somatic dysfunction of lumbar region: Secondary | ICD-10-CM | POA: Diagnosis not present

## 2022-11-23 ENCOUNTER — Other Ambulatory Visit: Payer: Self-pay | Admitting: Endocrinology

## 2022-11-24 ENCOUNTER — Encounter: Payer: Self-pay | Admitting: Family Medicine

## 2022-12-09 ENCOUNTER — Other Ambulatory Visit (INDEPENDENT_AMBULATORY_CARE_PROVIDER_SITE_OTHER): Payer: BC Managed Care – PPO

## 2022-12-09 ENCOUNTER — Other Ambulatory Visit: Payer: Self-pay | Admitting: Endocrinology

## 2022-12-09 DIAGNOSIS — E230 Hypopituitarism: Secondary | ICD-10-CM

## 2022-12-09 DIAGNOSIS — Z5181 Encounter for therapeutic drug level monitoring: Secondary | ICD-10-CM

## 2022-12-09 LAB — PSA: PSA: 1.1 ng/mL (ref 0.10–4.00)

## 2022-12-10 ENCOUNTER — Encounter: Payer: BC Managed Care – PPO | Admitting: Family Medicine

## 2022-12-14 ENCOUNTER — Encounter: Payer: Self-pay | Admitting: Endocrinology

## 2022-12-14 ENCOUNTER — Ambulatory Visit (INDEPENDENT_AMBULATORY_CARE_PROVIDER_SITE_OTHER): Payer: BC Managed Care – PPO | Admitting: Endocrinology

## 2022-12-14 VITALS — BP 115/80 | HR 52 | Ht 70.0 in | Wt 187.2 lb

## 2022-12-14 DIAGNOSIS — E230 Hypopituitarism: Secondary | ICD-10-CM

## 2022-12-14 DIAGNOSIS — E2740 Unspecified adrenocortical insufficiency: Secondary | ICD-10-CM | POA: Diagnosis not present

## 2022-12-14 MED ORDER — HYDROCORTISONE 20 MG PO TABS: ORAL_TABLET | ORAL | 3 refills | Status: DC

## 2022-12-14 MED ORDER — JATENZO 198 MG PO CAPS: ORAL_CAPSULE | ORAL | 1 refills | Status: DC

## 2022-12-14 NOTE — Progress Notes (Signed)
Outpatient Endocrinology Note [place] Filimon Miranda, MD  12/14/22  Patient's Name: Brandon Andrews    DOB: [birth year]    MRN: [id number]  REASON OF VISIT: Follow up for adrenal insufficiency/hypogonadism.  PCP: Jeani Sow, MD  HISTORY OF PRESENT ILLNESS:   Brandon Andrews is a 57 y.o. old male with past medical history listed below, is here for follow up  of adrenal insufficiency/hypogonadism.  Patient was last seen by Dr. Lucianne Muss in May 2024.  Pertinent History:  #Adrenal insufficiency : From June 2021 patient had symptoms of headache, weakness, weight loss, joint pain.  In October 2021 patient was evaluated by rheumatology and given prednisone 10 mg twice daily which she took for 3 weeks and improved his energy level, mood and weakness.  He was taken off prednisone in early December.  He was initially evaluated in endocrinology clinic due to low undetectable cortisol in December 2021.  He had symptoms of weakness, weight loss and decreased appetite with low blood pressure subsequently had a lab with undetectable cortisol level even after ACTH stimulation test and had ACTH level undetectable.  With a diagnosis of adrenal insufficiency patient was started on hydrocortisone 20 mg in the morning and 10 mg in the afternoon on March 29, 2020.   Latest Reference Range & Units 03/27/20 14:51 03/27/20 15:53 03/28/20 13:48  ACTH 7.2 - 63.3 pg/mL   <1.5 (L)  Cortisol, Plasma ug/dL 0.0 0.0   (L): Data is abnormally low   # Hypothyroidism Patient reportedly had testosterone deficiency 14 to 15 years, previously was treated with AndroGel, he only has symptoms of fatigue since late 2021.  Most of the testosterone levels had been over 300 with PCP office in the past.  With the laboratory evaluation of hypogonadism had 3 TSH level at baseline was significantly low with normal LH and has been on AndroGel 1.62%.  Prolactin level at baseline was increased to 26 and subsequently back to normal.  In August  2021 he had mildly increasing TSH of 5.05 and was started on levothyroxine 25 mcg without any benefit, subsequently free T4 has been normal without levothyroxine supplement.  IGF-I normal in 2021.  MRI pituitary in January 2022 showed normal pituitary gland.  During initial endocrinology evaluation patient was diagnosed with partial hypopituitarism related to likely episode of ? hypophysitis in August 2021 etiology unclear.  He has hypogonadism and adrenal insufficiency.  He has been on glucocorticoid replacement with hydrocortisone and testosterone replacement.  Used to be on topical testosterone AndroGel and changed to oral testosterone Jatenzo in 07/2022.    Interval history 12/14/22 He has been taking hydrocortisone 20 mg in the morning and 10 mg in the afternoon.  He states additional dose of hydrocortisone in case of feeling weak or any stressful time.  In July he had gastroenteritis and had symptoms suggestive of severe adrenal insufficiency improved with taking additional doses of hydrocortisone.  Overall feeling good.  No dizziness.  No nausea or vomiting.  No abdominal pain.  Body weight is relatively stable.  His recent electrolytes on the labs normal.  He has been taking Jatenzo 198 mg 2 times a day.  Recent testosterone labs normal.  Recent PSA, hematocrit and liver enzymes are normal.   Latest Reference Range & Units 11/05/22 13:57  Testosterone 300.00 - 890.00 ng/dL 409.81    REVIEW OF SYSTEMS:  As per history of present illness.   PAST MEDICAL HISTORY: Past Medical History:  Diagnosis Date   Adrenal failure (HCC)  Arthritis    Depression    Hemangioma of liver    Pituitary deficiency (HCC)     PAST SURGICAL HISTORY: Past Surgical History:  Procedure Laterality Date   JOINT REPLACEMENT Bilateral 2020   Bilateral hip replacement   SHOULDER SURGERY Bilateral    right, 1985, 2006, left, 2008   VASECTOMY     WRIST SURGERY Left 1991    ALLERGIES: No Known  Allergies  FAMILY HISTORY:  Family History  Problem Relation Age of Onset   Hyperlipidemia Father    Heart disease Father    Hearing loss Father    Hypertension Father    Heart attack Father    Thyroid cancer Sister    Diabetes Paternal Grandmother     SOCIAL HISTORY: Social History   Socioeconomic History   Marital status: Married    Spouse name: Not on file   Number of children: 1   Years of education: Not on file   Highest education level: Not on file  Occupational History   Not on file  Tobacco Use   Smoking status: Never   Smokeless tobacco: Never  Substance and Sexual Activity   Alcohol use: Never   Drug use: Never   Sexual activity: Not on file  Other Topics Concern   Not on file  Social History Narrative   IT-ops mgr   Social Determinants of Health   Financial Resource Strain: Not on file  Food Insecurity: Not on file  Transportation Needs: Not on file  Physical Activity: Not on file  Stress: Not on file  Social Connections: Not on file    MEDICATIONS:  Current Outpatient Medications  Medication Sig Dispense Refill   buPROPion (WELLBUTRIN XL) 150 MG 24 hr tablet Take 1 tablet (150 mg total) by mouth daily. 90 tablet 1   hydrocortisone (CORTEF) 20 MG tablet TAKE ONE TABLET BY MOUTH EVERY MORNING ONE-HALF TABLET EVERY evening. MAY take double doses FOR stress situations 150 tablet 3   JATENZO 198 MG CAPS 1 capsule twice a day with food 180 capsule 1   No current facility-administered medications for this visit.    PHYSICAL EXAM: Vitals:   12/14/22 0807  BP: 115/80  Pulse: (!) 52  SpO2: 98%  Weight: 187 lb 3.2 oz (84.9 kg)  Height: 5\' 10"  (1.778 m)   Body mass index is 26.86 kg/m.   General: Well developed, well nourished male in no apparent distress. HEENT: AT/[place], no external lesions. Hearing intact to the spoken word Eyes:  Conjunctiva clear and no icterus.  Neurologic: Alert, oriented, normal speech Extremities: No pedal pitting  edema Skin: color good.  Psychiatric: Does not appear depressed or anxious  PERTINENT HISTORIC LABORATORY AND IMAGING STUDIES:  All pertinent laboratory results were reviewed. Please see HPI also for further details.   ASSESSMENT / PLAN  1. Hypogonadotropic hypogonadism (HCC)   2. Hypopituitarism (HCC)   3. Adrenal insufficiency (HCC)     Patient has possible hypopituitarism with adrenal insufficiency and hypogonadotropic hypogonadism, he was diagnosed in December 2021 and has been on glucocorticoid and testosterone supplement.  No history of other greater hormone deficiencies.  Adrenal insufficiency: -Continue hydrocortisone 20 mg in the morning and 10 mg in the afternoon.  Discussed about taking additional doses of hydrocortisone 2-3 times the usual days in case of illness. -Asked to wear medical alert of adrenal insufficiency bracelet or necklace.  Hypogonadism -Recent total testosterone normal. -Continue current dose of Jatenzo 198 mg 2 times a day.  Will check  total testosterone, BMP and thyroid lab 1 week prior to follow-up visit in 6 months.   Diagnoses and all orders for this visit:  Hypogonadotropic hypogonadism (HCC) -     Testosterone, Total, LC/MS/MS; Future  Hypopituitarism (HCC) -     T4, free; Future -     TSH; Future  Adrenal insufficiency (HCC) -     Basic Metabolic Panel (BMET); Future  Other orders -     JATENZO 198 MG CAPS; 1 capsule twice a day with food -     hydrocortisone (CORTEF) 20 MG tablet; TAKE ONE TABLET BY MOUTH EVERY MORNING ONE-HALF TABLET EVERY evening. MAY take double doses FOR stress situations    DISPOSITION Follow up in clinic in 6 months suggested.  All questions answered and patient verbalized understanding of the plan.  [place] Jaymi Tinner, MD [facility] Endocrinology [facility] [address] [place], [address] [place], [place] [address] Phone # [phone]  At least part of this note was generated using voice recognition  software. Inadvertent word errors may have occurred, which were not recognized during the proofreading process.

## 2022-12-18 DIAGNOSIS — Z96643 Presence of artificial hip joint, bilateral: Secondary | ICD-10-CM | POA: Diagnosis not present

## 2022-12-18 DIAGNOSIS — M9901 Segmental and somatic dysfunction of cervical region: Secondary | ICD-10-CM | POA: Diagnosis not present

## 2022-12-18 DIAGNOSIS — M9903 Segmental and somatic dysfunction of lumbar region: Secondary | ICD-10-CM | POA: Diagnosis not present

## 2022-12-18 DIAGNOSIS — Z4732 Aftercare following explantation of hip joint prosthesis: Secondary | ICD-10-CM | POA: Diagnosis not present

## 2023-01-01 ENCOUNTER — Encounter: Payer: Self-pay | Admitting: Family Medicine

## 2023-01-01 ENCOUNTER — Ambulatory Visit (INDEPENDENT_AMBULATORY_CARE_PROVIDER_SITE_OTHER): Payer: BC Managed Care – PPO | Admitting: Family Medicine

## 2023-01-01 VITALS — BP 112/74 | HR 60 | Temp 97.6°F | Ht 70.0 in | Wt 184.8 lb

## 2023-01-01 DIAGNOSIS — Z13220 Encounter for screening for lipoid disorders: Secondary | ICD-10-CM

## 2023-01-01 DIAGNOSIS — F339 Major depressive disorder, recurrent, unspecified: Secondary | ICD-10-CM

## 2023-01-01 DIAGNOSIS — E230 Hypopituitarism: Secondary | ICD-10-CM | POA: Diagnosis not present

## 2023-01-01 DIAGNOSIS — Z0000 Encounter for general adult medical examination without abnormal findings: Secondary | ICD-10-CM | POA: Diagnosis not present

## 2023-01-01 DIAGNOSIS — Z131 Encounter for screening for diabetes mellitus: Secondary | ICD-10-CM | POA: Diagnosis not present

## 2023-01-01 DIAGNOSIS — R0789 Other chest pain: Secondary | ICD-10-CM

## 2023-01-01 DIAGNOSIS — Z23 Encounter for immunization: Secondary | ICD-10-CM | POA: Diagnosis not present

## 2023-01-01 LAB — CBC WITH DIFFERENTIAL/PLATELET
Basophils Absolute: 0.1 10*3/uL (ref 0.0–0.1)
Basophils Relative: 0.7 % (ref 0.0–3.0)
Eosinophils Absolute: 0.3 10*3/uL (ref 0.0–0.7)
Eosinophils Relative: 4 % (ref 0.0–5.0)
HCT: 46.8 % (ref 39.0–52.0)
Hemoglobin: 15.4 g/dL (ref 13.0–17.0)
Lymphocytes Relative: 22.4 % (ref 12.0–46.0)
Lymphs Abs: 1.9 10*3/uL (ref 0.7–4.0)
MCHC: 33 g/dL (ref 30.0–36.0)
MCV: 94.7 fL (ref 78.0–100.0)
Monocytes Absolute: 0.5 10*3/uL (ref 0.1–1.0)
Monocytes Relative: 6.3 % (ref 3.0–12.0)
Neutro Abs: 5.5 10*3/uL (ref 1.4–7.7)
Neutrophils Relative %: 66.6 % (ref 43.0–77.0)
Platelets: 339 10*3/uL (ref 150.0–400.0)
RBC: 4.94 Mil/uL (ref 4.22–5.81)
RDW: 14.4 % (ref 11.5–15.5)
WBC: 8.3 10*3/uL (ref 4.0–10.5)

## 2023-01-01 LAB — TSH: TSH: 2.23 u[IU]/mL (ref 0.35–5.50)

## 2023-01-01 LAB — LIPID PANEL
Cholesterol: 148 mg/dL (ref 0–200)
HDL: 55 mg/dL (ref >39.00)
LDL Cholesterol: 81 mg/dL (ref 0–99)
NonHDL: 93.24
Total CHOL/HDL Ratio: 3
Triglycerides: 61 mg/dL (ref 0.0–149.0)
VLDL: 12.2 mg/dL (ref 0.0–40.0)

## 2023-01-01 LAB — HEMOGLOBIN A1C: Hgb A1c MFr Bld: 5.3 % (ref 4.6–6.5)

## 2023-01-01 NOTE — Patient Instructions (Signed)
It was very nice to see you today!  Tumeric and ginger   PLEASE NOTE:  If you had any lab tests please let us know if you have not heard back within a few days. You may see your results on MyChart before we have a chance to review them but we will give you a call once they are reviewed by [place]. If we ordered any referrals today, please let us know if you have not heard from their office within the next week.   Please try these tips to maintain a healthy lifestyle:  Eat most of your calories during the day when you are active. Eliminate processed foods including packaged sweets (pies, cakes, cookies), reduce intake of potatoes, white bread, white pasta, and white rice. Look for whole grain options, oat flour or almond flour.  Each meal should contain half fruits/vegetables, one quarter protein, and one quarter carbs (no bigger than a computer mouse).  Cut down on sweet beverages. This includes juice, soda, and sweet tea. Also watch fruit intake, though this is a healthier sweet option, it still contains natural sugar! Limit to 3 servings daily.  Drink at least 1 glass of water with each meal and aim for at least 8 glasses per day  Exercise at least 150 minutes every week.

## 2023-01-01 NOTE — Progress Notes (Signed)
Phone: [phone]   Subjective:  Patient 57 y.o. male presenting for annual physical.  No chief complaint on file.  Annual - ***  *** - ***  *** - ***  *** - ***  *** - ***  *** - ***   See problem oriented charting- ROS- ROS: Gen: no fever, chills  Skin: no rash, itching ENT: no ear pain, ear drainage, nasal congestion, rhinorrhea, sinus pressure, sore throat Eyes: no blurry vision, double vision Resp: no cough, wheeze,SOB CV: no CP, palpitations, LE edema,  GI: no heartburn, n/v/d/c, abd pain GU: no dysuria, urgency, frequency, hematuria MSK: no joint pain, myalgias, back pain Neuro: no dizziness, headache, weakness, vertigo Psych: no depression, anxiety, insomnia, SI   The following were reviewed and entered/updated in epic: Past Medical History:  Diagnosis Date   Adrenal failure (HCC)    Arthritis    Depression    Hemangioma of liver    Pituitary deficiency [facility])    Patient Active Problem List   Diagnosis Date Noted   Hypopituitarism (HCC) 04/15/2020   Past Surgical History:  Procedure Laterality Date   JOINT REPLACEMENT Bilateral 2020   Bilateral hip replacement   SHOULDER SURGERY Bilateral    right, 1985, 2006, left, 2008   VASECTOMY     WRIST SURGERY Left 1991    Family History  Problem Relation Age of Onset   Hyperlipidemia Father    Heart disease Father    Hearing loss Father    Hypertension Father    Heart attack Father    Thyroid cancer Sister    Diabetes Paternal Grandmother     Medications- reviewed and updated Current Outpatient Medications  Medication Sig Dispense Refill   buPROPion (WELLBUTRIN XL) 150 MG 24 hr tablet Take 1 tablet (150 mg total) by mouth daily. 90 tablet 1   hydrocortisone (CORTEF) 20 MG tablet TAKE ONE TABLET BY MOUTH EVERY MORNING ONE-HALF TABLET EVERY evening. MAY take double doses FOR stress situations 150 tablet 3   JATENZO 198 MG CAPS 1 capsule twice a day with food 180 capsule 1   No current  facility-administered medications for this visit.    Allergies-reviewed and updated No Known Allergies  Social History   Social History Narrative   IT-ops mgr   Objective  Objective:  There were no vitals taken for this visit. Physical Exam  Gen: WDWN NAD HEENT: NCAT, conjunctiva not injected, sclera nonicteric TM WNL B, OP moist, no exudates  NECK:  supple, no thyromegaly, no nodes, no carotid bruits CARDIAC: RRR, S1S2+, no murmur. DP 2+B LUNGS: CTAB. No wheezes ABDOMEN:  BS+, soft, NTND, No HSM, no masses EXT:  no edema MSK: no gross abnormalities. MS 5/5 all 4 NEURO: A&O x3.  CN II-XII intact.  PSYCH: normal mood. Good eye contact     Assessment and Plan   Health Maintenance counseling: 1. Anticipatory guidance: Patient counseled regarding regular dental exams q6 months, eye exams yearly, avoiding smoking and second hand smoke, limiting alcohol to 2 beverages per day.   2. Risk factor reduction:  Advised patient of need for regular exercise and diet rich in fruits and vegetables to reduce risk of heart attack and stroke. Exercise- ***.   Wt Readings from Last 3 Encounters:  12/14/22 187 lb 3.2 oz (84.9 kg)  10/22/22 181 lb (82.1 kg)  08/14/22 191 lb 6.4 oz (86.8 kg)   3. Immunizations/screenings/ancillary studies Immunization History  Administered Date(s) Administered   Influenza Inj Mdck Quad Pf 12/14/2018   Influenza  Split 02/14/2021   Influenza,inj,Quad PF,6+ Mos 02/19/2018, 12/08/2021   Influenza,inj,quad, With Preservative 02/19/2018, 12/14/2018   Moderna Sars-Covid-2 Vaccination 06/14/2019, 07/12/2019   Td 12/21/2013   Tdap 08/16/2015   Zoster Recombinant(Shingrix) 01/09/2019, 03/14/2019   Health Maintenance Due  Topic Date Due   COVID-19 Vaccine (3 - Moderna risk series) 08/09/2019   INFLUENZA VACCINE  10/29/2022    4. Prostate cancer screening >55yo - risk factors?  Lab Results  Component Value Date   PSA 1.10 12/09/2022   PSA 0.63 12/08/2021     5. Colon cancer screening: *** 6. Skin cancer screening- *** advised regular sunscreen use. Denies worrisome, changing, or new skin lesions.  7. Smoking associated screening (lung cancer screening, AAA screen 65-75, UA)- *** smoker- ***ppd 8. STD screening - ***  There are no diagnoses linked to this encounter.  Recommended follow up: No follow-ups on file.  Lab/Order associations: ***fasting    I,Rachel Rivera,acting as a scribe for Angelena Sole, MD.,have documented all relevant documentation on the behalf of Angelena Sole, MD,as directed by  Angelena Sole, MD while in the presence of Angelena Sole, MD.  I, Isabelle Course, have reviewed all documentation for this visit. The documentation on 01/01/23 for the exam, diagnosis, procedures, and orders are all accurate and complete.  ***   Isabelle Course

## 2023-01-02 DIAGNOSIS — F339 Major depressive disorder, recurrent, unspecified: Secondary | ICD-10-CM | POA: Insufficient documentation

## 2023-01-02 LAB — IRON,TIBC AND FERRITIN PANEL
%SAT: 39 % (ref 20–48)
Ferritin: 52 ng/mL (ref 38–380)
Iron: 142 ug/dL (ref 50–180)
TIBC: 367 ug/dL (ref 250–425)

## 2023-01-02 NOTE — Assessment & Plan Note (Signed)
Chronic.  Well-controlled on hydrocortisone 20 mg in the a.m. and 10 mg in the p.m. andJatenzo 198mg  bid.  Followed by endocrinology Dr. Lucianne Muss

## 2023-01-02 NOTE — Assessment & Plan Note (Signed)
Chronic.  Well-controlled on Wellbutrin XL 150 mg daily.  Continue

## 2023-01-03 NOTE — Progress Notes (Signed)
Labs are excellent

## 2023-01-15 DIAGNOSIS — Z96643 Presence of artificial hip joint, bilateral: Secondary | ICD-10-CM | POA: Diagnosis not present

## 2023-01-15 DIAGNOSIS — M9903 Segmental and somatic dysfunction of lumbar region: Secondary | ICD-10-CM | POA: Diagnosis not present

## 2023-01-15 DIAGNOSIS — Z4732 Aftercare following explantation of hip joint prosthesis: Secondary | ICD-10-CM | POA: Diagnosis not present

## 2023-01-15 DIAGNOSIS — M9901 Segmental and somatic dysfunction of cervical region: Secondary | ICD-10-CM | POA: Diagnosis not present

## 2023-01-29 DIAGNOSIS — M545 Low back pain, unspecified: Secondary | ICD-10-CM | POA: Diagnosis not present

## 2023-01-29 DIAGNOSIS — Z4732 Aftercare following explantation of hip joint prosthesis: Secondary | ICD-10-CM | POA: Diagnosis not present

## 2023-01-29 DIAGNOSIS — Z96643 Presence of artificial hip joint, bilateral: Secondary | ICD-10-CM | POA: Diagnosis not present

## 2023-01-29 DIAGNOSIS — M9901 Segmental and somatic dysfunction of cervical region: Secondary | ICD-10-CM | POA: Diagnosis not present

## 2023-02-12 ENCOUNTER — Encounter: Payer: Self-pay | Admitting: Endocrinology

## 2023-02-12 DIAGNOSIS — M9901 Segmental and somatic dysfunction of cervical region: Secondary | ICD-10-CM | POA: Diagnosis not present

## 2023-02-12 DIAGNOSIS — Z4732 Aftercare following explantation of hip joint prosthesis: Secondary | ICD-10-CM | POA: Diagnosis not present

## 2023-02-12 DIAGNOSIS — M545 Low back pain, unspecified: Secondary | ICD-10-CM | POA: Diagnosis not present

## 2023-02-12 DIAGNOSIS — Z96643 Presence of artificial hip joint, bilateral: Secondary | ICD-10-CM | POA: Diagnosis not present

## 2023-03-01 NOTE — Progress Notes (Signed)
CARDIOLOGY CONSULT NOTE       Patient ID: Brandon Andrews MRN: [id number] DOB/AGE: Dec 13, 1965 57 y.o. Referring Physician: Ruthine Dose Primary Physician: Jeani Sow, MD Primary Cardiologist: New Reason for Consultation: Chest Pain    HPI:  57 y.o. referred by Dr Ruthine Dose for chest pain He has a history of depression on welbutrin and pituitary deficiency on Cortef. He also taked [place] which is a form of testosterone replacement TSH has been normal. As has A1c and LDL is 81 His Hct is in normal range 46.8 on testosterone 11/05/22 level was normal at 430 having been low on 08/03/22 and 10/29/22.    His wife Darl Pikes is an Programmer, systems and taught my kids in grade school Daughter Rayfield Citizen going to Case Therapist, music school.   Diagnosed with Addison's after having COVID vaccine and two hip replacements within 6 months   Has had 6-8 episodes of atypical chest pain over the last 3 months. Pain is resting not exertional lasting minutes No associated dyspnea, pleurisy. No trauma  ROS All other systems reviewed and negative except as noted above  Past Medical History:  Diagnosis Date   Adrenal failure (HCC)    Arthritis    Depression    Hemangioma of liver    Pituitary deficiency (HCC)     Family History  Problem Relation Age of Onset   Hyperlipidemia Father    Heart disease Father    Hearing loss Father    Hypertension Father    Heart attack Father    Thyroid cancer Sister    Diabetes Paternal Grandmother     Social History   Socioeconomic History   Marital status: Married    Spouse name: Not on file   Number of children: 1   Years of education: Not on file   Highest education level: Not on file  Occupational History   Not on file  Tobacco Use   Smoking status: Never   Smokeless tobacco: Never  Substance and Sexual Activity   Alcohol use: Never   Drug use: Never   Sexual activity: Not on file  Other Topics Concern   Not on file  Social History Narrative    IT-ops mgr   Social Determinants of Health   Financial Resource Strain: Not on file  Food Insecurity: Not on file  Transportation Needs: Not on file  Physical Activity: Not on file  Stress: Not on file  Social Connections: Not on file  Intimate Partner Violence: Not on file    Past Surgical History:  Procedure Laterality Date   JOINT REPLACEMENT Bilateral 2020   Bilateral hip replacement   SHOULDER SURGERY Bilateral    right, 1985, 2006, left, 2008   VASECTOMY     WRIST SURGERY Left 1991      Current Outpatient Medications:    buPROPion (WELLBUTRIN XL) 150 MG 24 hr tablet, Take 1 tablet (150 mg total) by mouth daily., Disp: 90 tablet, Rfl: 1   hydrocortisone (CORTEF) 20 MG tablet, TAKE ONE TABLET BY MOUTH EVERY MORNING ONE-HALF TABLET EVERY evening. MAY take double doses FOR stress situations, Disp: 150 tablet, Rfl: 3   JATENZO 198 MG CAPS, 1 capsule twice a day with food, Disp: 180 capsule, Rfl: 1    Physical Exam: There were no vitals taken for this visit.    Affect appropriate Healthy:  appears stated age HEENT: normal Neck supple with no adenopathy JVP normal no bruits no thyromegaly Lungs clear with no wheezing and good diaphragmatic motion Heart:  S1/S2 no murmur, no rub, gallop or click PMI normal Abdomen: benighn, BS positve, no tenderness, no AAA no bruit.  No HSM or HJR Distal pulses intact with no bruits No edema Neuro non-focal Skin warm and dry No muscular weakness   Labs:   Lab Results  Component Value Date   WBC 8.3 01/01/2023   HGB 15.4 01/01/2023   HCT 46.8 01/01/2023   MCV 94.7 01/01/2023   PLT 339.0 01/01/2023   No results for input(s): "NA", "K", "CL", "CO2", "BUN", "CREATININE", "CALCIUM", "PROT", "BILITOT", "ALKPHOS", "ALT", "AST", "GLUCOSE" in the last 168 hours.  Invalid input(s): "LABALBU" No results found for: "CKTOTAL", "CKMB", "CKMBINDEX", "TROPONINI"  Lab Results  Component Value Date   CHOL 148 01/01/2023   CHOL 169  12/08/2021   Lab Results  Component Value Date   HDL 55.00 01/01/2023   HDL 52.60 12/08/2021   Lab Results  Component Value Date   LDLCALC 81 01/01/2023   LDLCALC 95 12/08/2021   Lab Results  Component Value Date   TRIG 61.0 01/01/2023   TRIG 108.0 12/08/2021   Lab Results  Component Value Date   CHOLHDL 3 01/01/2023   CHOLHDL 3 12/08/2021   No results found for: "LDLDIRECT"    Radiology: No results found.  EKG: SR rate 66 early repol normal    ASSESSMENT AND PLAN:   Chest pain: Normal ECG Atypical sharp and resting. Shared decision making favor ETT and calcium score Hypopit:  continue cortef CT with no pituitary mass 04/10/20  Low T: seems to be on appropriate replacement with no excess Hct Depression continue welbutrin   ETT Calcium score   F/U PRN pending studies  Signed: Charlton Haws 03/01/2023, 1:48 PM

## 2023-03-10 ENCOUNTER — Encounter: Payer: Self-pay | Admitting: Cardiovascular Disease

## 2023-03-10 ENCOUNTER — Ambulatory Visit: Payer: BC Managed Care – PPO | Attending: Cardiovascular Disease | Admitting: Cardiovascular Disease

## 2023-03-10 VITALS — BP 140/62 | HR 67 | Ht 70.0 in | Wt 189.4 lb

## 2023-03-10 DIAGNOSIS — R079 Chest pain, unspecified: Secondary | ICD-10-CM | POA: Diagnosis not present

## 2023-03-10 DIAGNOSIS — Z09 Encounter for follow-up examination after completed treatment for conditions other than malignant neoplasm: Secondary | ICD-10-CM

## 2023-03-10 NOTE — Patient Instructions (Signed)
Medication Instructions:  Your physician recommends that you continue on your current medications as directed. Please refer to the Current Medication list given to you today.  *If you need a refill on your cardiac medications before your next appointment, please call your pharmacy*  Lab Work: If you have labs (blood work) drawn today and your tests are completely normal, you will receive your results only by: MyChart Message (if you have MyChart) OR A paper copy in the mail If you have any lab test that is abnormal or we need to change your treatment, we will call you to review the results.  Testing/Procedures: Cardiac CT scanning for calcium score as soon as possible (CAT scanning), is a noninvasive, special x-ray that produces cross-sectional images of the body using x-rays and a computer. CT scans help physicians diagnose and treat medical conditions. For some CT exams, a contrast material is used to enhance visibility in the area of the body being studied. CT scans provide greater clarity and reveal more details than regular x-ray exams.  Your physician has requested that you have an exercise tolerance test as soon as possible. For further information please visit [email or web address] Please also follow instruction sheet, as given.  Follow-Up: At [facility], you and your health needs are our priority.  As part of our continuing mission to provide you with exceptional heart care, we have created designated Provider Care Teams.  These Care Teams include your primary Cardiologist (physician) and Advanced Practice Providers (APPs -  Physician Assistants and Nurse Practitioners) who all work together to provide you with the care you need, when you need it.  We recommend signing up for the patient portal called "MyChart".  Sign up information is provided on this After Visit Summary.  MyChart is used to connect with patients for Virtual Visits (Telemedicine).  Patients are able to view  lab/test results, encounter notes, upcoming appointments, etc.  Non-urgent messages can be sent to your provider as well.   To learn more about what you can do with MyChart, go to [email or web address].    Your next appointment:   12 month(s)  Provider:   Charlton Haws, MD

## 2023-03-15 ENCOUNTER — Other Ambulatory Visit: Payer: Self-pay

## 2023-03-15 MED ORDER — JATENZO 198 MG PO CAPS: ORAL_CAPSULE | ORAL | 5 refills | Status: DC

## 2023-03-15 NOTE — Telephone Encounter (Signed)
I had ordered as 90-day supply in the last visit in September however as far as I know from the company they only dispense 30 days at a time and refill every 30 days.  I have sent prescription as 30-day supply with 5 refills.  Iraq Evanie Buckle, MD [facility] [address] [place], [address] [place], [place] [address] Phone # [phone]

## 2023-03-16 ENCOUNTER — Encounter: Payer: Self-pay | Admitting: Family Medicine

## 2023-03-16 ENCOUNTER — Telehealth: Payer: Self-pay | Admitting: Endocrinology

## 2023-03-16 ENCOUNTER — Telehealth: Payer: BC Managed Care – PPO

## 2023-03-16 ENCOUNTER — Telehealth: Payer: BC Managed Care – PPO | Admitting: Physician Assistant

## 2023-03-16 DIAGNOSIS — J101 Influenza due to other identified influenza virus with other respiratory manifestations: Secondary | ICD-10-CM | POA: Diagnosis not present

## 2023-03-16 MED ORDER — OSELTAMIVIR PHOSPHATE 75 MG PO CAPS
75.0000 mg | ORAL_CAPSULE | Freq: Two times a day (BID) | ORAL | 0 refills | Status: DC
Start: 2023-03-16 — End: 2023-05-07

## 2023-03-16 MED ORDER — OSELTAMIVIR PHOSPHATE 75 MG PO CAPS
75.0000 mg | ORAL_CAPSULE | Freq: Two times a day (BID) | ORAL | 0 refills | Status: DC
Start: 2023-03-16 — End: 2023-03-16

## 2023-03-16 NOTE — Telephone Encounter (Signed)
Patient's daughter, Rayfield Citizen, is calling to say that a prior authorization is needed for Jatenzo JATENZO 198 MG CAPS.  Patient uses   [facility] [place], [place] - [address] (Ph: [phone])  Patient is out of the medication times 2 days per Rayfield Citizen.  Per Rayfield Citizen what can Mr. Rosten do about medication in the meantime?  Caroline's phone # is [phone].

## 2023-03-16 NOTE — Telephone Encounter (Signed)
He may have to wait until Brooks Sailors got approved and able to get it.  It is okay to be off of the medication for couple of days.  We can plan for alternative if it is not approved after prior authorization.

## 2023-03-16 NOTE — Progress Notes (Signed)
E visit for Flu like symptoms   We are sorry that you are not feeling well.  Here is how we plan to help! Based on what you have shared with me it looks like you may have a respiratory virus that may be influenza.  Influenza or "the flu" is   an infection caused by a respiratory virus. The flu virus is highly contagious and persons who did not receive their yearly flu vaccination may "catch" the flu from close contact.  We have anti-viral medications to treat the viruses that cause this infection. They are not a "cure" and only shorten the course of the infection. These prescriptions are most effective when they are given within the first 2 days of "flu" symptoms. Antiviral medication are indicated if you have a high risk of complications from the flu. You should  also consider an antiviral medication if you are in close contact with someone who is at risk. These medications can help patients avoid complications from the flu  but have side effects that you should know. Possible side effects from Tamiflu or oseltamivir include nausea, vomiting, diarrhea, dizziness, headaches, eye redness, sleep problems or other respiratory symptoms. You should not take Tamiflu if you have an allergy to oseltamivir or any to the ingredients in Tamiflu.  Based upon your symptoms and potential risk factors I have prescribed Oseltamivir (Tamiflu).  It has been sent to your designated pharmacy.  You will take one 75 mg capsule orally twice a day for the next 5 days.  ANYONE WHO HAS FLU SYMPTOMS SHOULD: Stay home. The flu is highly contagious and going out or to work exposes others! Be sure to drink plenty of fluids. Water is fine as well as fruit juices, sodas and electrolyte beverages. You may want to stay away from caffeine or alcohol. If you are nauseated, try taking small sips of liquids. How do you know if you are getting enough fluid? Your urine should be a pale yellow or almost colorless. Get rest. Taking a steamy  shower or using a humidifier may help nasal congestion and ease sore throat pain. Using a saline nasal spray works much the same way. Cough drops, hard candies and sore throat lozenges may ease your cough. Line up a caregiver. Have someone check on you regularly.   GET HELP RIGHT AWAY IF: You cannot keep down liquids or your medications. You become short of breath Your fell like you are going to pass out or loose consciousness. Your symptoms persist after you have completed your treatment plan MAKE SURE YOU  Understand these instructions. Will watch your condition. Will get help right away if you are not doing well or get worse.  Your e-visit answers were reviewed by a board certified advanced clinical practitioner to complete your personal care plan.  Depending on the condition, your plan could have included both over the counter or prescription medications.  If there is a problem please reply  once you have received a response from your provider.  Your safety is important to us.  If you have drug allergies check your prescription carefully.    You can use MyChart to ask questions about today's visit, request a non-urgent call back, or ask for a work or school excuse for 24 hours related to this e-Visit. If it has been greater than 24 hours you will need to follow up with your provider, or enter a new e-Visit to address those concerns.  You will get an e-mail in the next   two days asking about your experience.  I hope that your e-visit has been valuable and will speed your recovery. Thank you for using e-visits.  I have spent 5 minutes in review of e-visit questionnaire, review and updating patient chart, medical decision making and response to patient.   Meryl Hubers M Amadi Frady, PA-C  

## 2023-03-16 NOTE — Addendum Note (Signed)
Addended by: Marcelline Mates C on: 03/16/2023 12:00 PM   Modules accepted: Orders

## 2023-03-19 ENCOUNTER — Ambulatory Visit (HOSPITAL_COMMUNITY): Payer: BC Managed Care – PPO

## 2023-03-22 ENCOUNTER — Ambulatory Visit (HOSPITAL_COMMUNITY)
Admission: RE | Admit: 2023-03-22 | Discharge: 2023-03-22 | Disposition: A | Discharge status: Home / Self Care | Payer: Self-pay | Source: Ambulatory Visit | Attending: Cardiovascular Disease | Admitting: Cardiovascular Disease

## 2023-03-22 DIAGNOSIS — R079 Chest pain, unspecified: Secondary | ICD-10-CM | POA: Insufficient documentation

## 2023-03-22 DIAGNOSIS — Z09 Encounter for follow-up examination after completed treatment for conditions other than malignant neoplasm: Secondary | ICD-10-CM | POA: Insufficient documentation

## 2023-03-22 NOTE — Telephone Encounter (Signed)
Please contact the PRIOR AUTH TEAM directly and request an update and/or expedite the PA since this request is at least 36 weeks old

## 2023-03-22 NOTE — Telephone Encounter (Signed)
Patient called advising that he has still not gotten his medication PA ans is concerned that no one has taken care of this matter. Please call patient. ASAP

## 2023-03-23 ENCOUNTER — Other Ambulatory Visit (HOSPITAL_COMMUNITY): Payer: Self-pay

## 2023-03-23 ENCOUNTER — Telehealth: Payer: Self-pay | Admitting: Pharmacy Technician

## 2023-03-23 NOTE — Telephone Encounter (Signed)
PA request has been Approved. New Encounter created for follow up. For additional info see Pharmacy Prior Auth telephone encounter from 03/23/23.

## 2023-03-23 NOTE — Telephone Encounter (Signed)
Pharmacy Patient Advocate Encounter  Received notification from [facility] that Prior Authorization for Jantenzo 198mg  has been APPROVED from 02/21/23 to 03/22/24. Spoke to pharmacy to process.Copay is $100 before his copay card. They are saying he can't use the copay card now b/c he has govt ins, but from what the pt is telling me & what I can see it is through his employer. They are going to call the pharmacy help desk.    PA #/Case ID/Reference #: PA Case ID #: [id number]

## 2023-03-23 NOTE — Telephone Encounter (Signed)
Pharmacy Patient Advocate Encounter   Received notification from Pt Calls Messages that prior authorization for Jatenzo 198mg  is required/requested.   Insurance verification completed.   The patient is insured through [employer] .   Per test claim: PA required; PA submitted to above mentioned insurance via CoverMyMeds Key/confirmation #/EOC [facility] Status is pending

## 2023-03-29 ENCOUNTER — Telehealth (HOSPITAL_COMMUNITY): Payer: Self-pay

## 2023-03-29 NOTE — Telephone Encounter (Signed)
Spoke to the patient, detailed instructions given. He stated that he would be here for his test. Asked to call back with any questions. S.Yolander Goodie CCT

## 2023-03-30 ENCOUNTER — Ambulatory Visit (HOSPITAL_COMMUNITY): Payer: BC Managed Care – PPO | Attending: Cardiovascular Disease

## 2023-03-30 DIAGNOSIS — R079 Chest pain, unspecified: Secondary | ICD-10-CM | POA: Diagnosis not present

## 2023-03-30 DIAGNOSIS — Z09 Encounter for follow-up examination after completed treatment for conditions other than malignant neoplasm: Secondary | ICD-10-CM | POA: Insufficient documentation

## 2023-03-30 LAB — EXERCISE TOLERANCE TEST
Peak HR: 166 {beats}/min
Rest HR: 61 {beats}/min
ST Depression (mm): 0 mm

## 2023-04-26 ENCOUNTER — Other Ambulatory Visit: Payer: Self-pay | Admitting: Physician Assistant

## 2023-05-07 ENCOUNTER — Encounter: Payer: Self-pay | Admitting: Family Medicine

## 2023-05-07 ENCOUNTER — Ambulatory Visit (INDEPENDENT_AMBULATORY_CARE_PROVIDER_SITE_OTHER): Payer: BC Managed Care – PPO | Admitting: Family Medicine

## 2023-05-07 VITALS — BP 146/80 | HR 55 | Temp 97.9°F | Resp 18 | Ht 70.0 in | Wt 193.0 lb

## 2023-05-07 DIAGNOSIS — L819 Disorder of pigmentation, unspecified: Secondary | ICD-10-CM | POA: Diagnosis not present

## 2023-05-07 DIAGNOSIS — R5383 Other fatigue: Secondary | ICD-10-CM | POA: Diagnosis not present

## 2023-05-07 DIAGNOSIS — R059 Cough, unspecified: Secondary | ICD-10-CM | POA: Diagnosis not present

## 2023-05-07 LAB — COMPREHENSIVE METABOLIC PANEL
ALT: 32 U/L (ref 0–53)
AST: 31 U/L (ref 0–37)
Albumin: 4.4 g/dL (ref 3.5–5.2)
Alkaline Phosphatase: 48 U/L (ref 39–117)
BUN: 15 mg/dL (ref 6–23)
CO2: 31 meq/L (ref 19–32)
Calcium: 9.6 mg/dL (ref 8.4–10.5)
Chloride: 101 meq/L (ref 96–112)
Creatinine, Ser: 1.07 mg/dL (ref 0.40–1.50)
GFR: 76.8 mL/min (ref >60.00)
Glucose, Bld: 70 mg/dL (ref 70–99)
Potassium: 4.4 meq/L (ref 3.5–5.1)
Sodium: 140 meq/L (ref 135–145)
Total Bilirubin: 0.6 mg/dL (ref 0.2–1.2)
Total Protein: 6.9 g/dL (ref 6.0–8.3)

## 2023-05-07 LAB — CBC WITH DIFFERENTIAL/PLATELET
Basophils Absolute: 0.1 10*3/uL (ref 0.0–0.1)
Basophils Relative: 0.6 % (ref 0.0–3.0)
Eosinophils Absolute: 0.7 10*3/uL (ref 0.0–0.7)
Eosinophils Relative: 7 % — ABNORMAL HIGH (ref 0.0–5.0)
HCT: 45.4 % (ref 39.0–52.0)
Hemoglobin: 15.2 g/dL (ref 13.0–17.0)
Lymphocytes Relative: 10.3 % — ABNORMAL LOW (ref 12.0–46.0)
Lymphs Abs: 1 10*3/uL (ref 0.7–4.0)
MCHC: 33.5 g/dL (ref 30.0–36.0)
MCV: 95.6 fL (ref 78.0–100.0)
Monocytes Absolute: 1.2 10*3/uL — ABNORMAL HIGH (ref 0.1–1.0)
Monocytes Relative: 12.6 % — ABNORMAL HIGH (ref 3.0–12.0)
Neutro Abs: 6.8 10*3/uL (ref 1.4–7.7)
Neutrophils Relative %: 69.5 % (ref 43.0–77.0)
Platelets: 333 10*3/uL (ref 150.0–400.0)
RBC: 4.75 Mil/uL (ref 4.22–5.81)
RDW: 14.5 % (ref 11.5–15.5)
WBC: 9.8 10*3/uL (ref 4.0–10.5)

## 2023-05-07 LAB — POCT INFLUENZA A/B
Influenza A, POC: NEGATIVE
Influenza B, POC: NEGATIVE

## 2023-05-07 LAB — POC COVID19 BINAXNOW: SARS Coronavirus 2 Ag: NEGATIVE

## 2023-05-07 NOTE — Progress Notes (Signed)
 Subjective:     Patient ID: Brandon Andrews, male    DOB: May 15, 1965, 58 y.o.   MRN: [id number]  Chief Complaint  Patient presents with   Skin Discoloration    Skin discoloration of palm of left hand and right cheek, noticed hand over 1 week ago and right side of face a couple of days ago   Fatigue    Wants to be tested for Covid and Flu due to recent illness in November/December   Generalized Body Aches   adrenaline Issues    HPI Discussed the use of AI scribe software for clinical note transcription with the patient, who gave verbal consent to proceed.  History of Present Illness   Brandon Andrews is a 58 year old male who presents with concerns of yellowing skin and recent cold symptoms.  He has noticed yellowing on the palm of his left hand for the last six weeks, described as 'wide streaking' localized to a specific area. His daughter observed yellowing on his right cheek, which was not visible the following morning. No bruising or trauma is associated with these areas. He is concerned about potential liver issues but denies excessive alcohol consumption, smoking, or use of amphetamines or tylenol He takes a multivitamin twice a week, zinc daily, vitamin C, and B12, and has recently used 'emere-C' supplements. He also consumes beet supplements, which he started around the same time he noticed the yellowing-eats beets as well.  He has experienced recent cold symptoms that began a few days ago, including a sore throat that was more pronounced yesterday, body aches, and fatigue. No respiratory issues or fever are present, although his temperature was slightly elevated for him at 39F, as he typically runs at 83F. He had influenza A, RSV, and COVID simultaneously from October through the end of the previous year, which was a difficult period due to his immunosuppressed status. He fears a recurrence of these illnesses but notes that recent tests for flu and COVID were  negative.  He reports a recent cold sore on his bottom lip that developed earlier this week, which he treated with his usual medication, preventing further progression.  He has a history of high blood pressure, which he monitors regularly. At home, his blood pressure typically runs around 130/70 to 130/80 mmHg, although it was elevated during the visit. He attributes this to recent caffeine intake. He has a family history of cardiovascular issues, as his brother underwent quadruple bypass surgery.       There are no preventive care reminders to display for this patient.  Past Medical History:  Diagnosis Date   Adrenal failure (HCC)    Arthritis    Chest pain    Depression    Hemangioma of liver    Pituitary deficiency [facility])     Past Surgical History:  Procedure Laterality Date   JOINT REPLACEMENT Bilateral 2020   Bilateral hip replacement   SHOULDER SURGERY Bilateral    right, 1985, 2006, left, 2008   VASECTOMY     WRIST SURGERY Left 1991     Current Outpatient Medications:    buPROPion  (WELLBUTRIN  XL) 150 MG 24 hr tablet, Take 1 tablet (150 mg total) by mouth daily., Disp: 90 tablet, Rfl: 1   cyanocobalamin (VITAMIN B12) 500 MCG tablet, Take 1 tablet by mouth 2 (two) times daily., Disp: , Rfl:    hydrocortisone  (CORTEF ) 20 MG tablet, TAKE ONE TABLET BY MOUTH EVERY MORNING ONE-HALF TABLET EVERY evening. MAY take double  doses FOR stress situations, Disp: 150 tablet, Rfl: 3   JATENZO  198 MG CAPS, 1 capsule twice a day with food, Disp: 60 capsule, Rfl: 5  No Known Allergies ROS neg/noncontributory except as noted HPI/below      Objective:     BP (!) 146/80   Pulse (!) 55   Temp 97.9 F (36.6 C) (Temporal)   Resp 18   Ht 5' 10 (1.778 m)   Wt 193 lb (87.5 kg)   SpO2 98%   BMI 27.69 kg/m  Wt Readings from Last 3 Encounters:  05/07/23 193 lb (87.5 kg)  03/10/23 189 lb 6.4 oz (85.9 kg)  01/01/23 184 lb 12.8 oz (83.8 kg)    Physical Exam   Gen: WDWN NAD HEENT:  NCAT, conjunctiva not injected, sclera nonicteric TM WNL B, OP moist, no exudates  congested NECK:  supple, no thyromegaly, no nodes, CARDIAC: RRR, S1S2+, no murmur.  LUNGS: CTAB. No wheezes ABDOMEN:  BS+, soft, NTND, No HSM, no masses EXT:  no edema MSK: no gross abnormalities.  NEURO: A&O x3.  CN II-XII intact.  PSYCH: normal mood. Good eye contact  No jaundice.  No yellow discoloration present today per pt  Results for orders placed or performed in visit on 05/07/23  POC COVID-19   Collection Time: 05/07/23  9:05 AM  Result Value Ref Range   SARS Coronavirus 2 Ag Negative Negative  POCT Influenza A/B   Collection Time: 05/07/23  9:05 AM  Result Value Ref Range   Influenza A, POC Negative Negative   Influenza B, POC Negative Negative        Assessment & Plan:  Cough, unspecified type -     POC COVID-19 BinaxNow -     POCT Influenza A/B  Other fatigue -     POC COVID-19 BinaxNow -     POCT Influenza A/B  Discoloration of skin -     CBC with Differential/Platelet -     Comprehensive metabolic panel  Assessment and Plan    Upper Respiratory Infection Recent onset of cold symptoms includes sore throat, body aches, and fatigue, with no fever. Tests are negative for flu and COVID-19, indicating a likely viral etiology. Antibiotics are not indicated. Supportive care with rest and hydration is recommended.  Yellow Skin Discoloration Intermittent yellowing of the left palm and right cheek has occurred over six weeks, without bruising or trauma. Differential diagnosis includes jaundice, caroteneemia, or medication side effects. No jaundice is observed on examination. There is a possible correlation with beet supplement intake. Liver function tests will be ordered to rule out serious conditions. Beet supplements will be discontinued temporarily, and skin discoloration will be monitored for changes.  Cold Sore A recent outbreak on the bottom lip was treated with topical  medication, resulting in symptom improvement. Cold sores are self-limiting and manageable with topical treatments. Continue topical treatment as needed.  elevated blood pressure Blood pressure readings are elevated at home and in the office, typically in the 130s/70-80s. There is a family history of cardiovascular disease. The importance of monitoring and potential need for medication if lifestyle modifications are insufficient was discussed. Untreated hypertension can lead to cardiovascular complications. Blood pressure will be monitored regularly, and lifestyle modifications including diet and exercise will be implemented. The need for antihypertensive medication will be reassessed if blood pressure remains elevated.  General Health Maintenance Generally healthy with regular exercise and vitamin supplementation. The importance of continuing current health maintenance practices was discussed. Continue the current exercise  regimen and vitamin supplementation.  Follow-up [facility]. Blood work will be ordered, and monitoring for any new or worsening symptoms will continue.        Return if symptoms worsen or fail to improve.  Jenkins CHRISTELLA Carrel, MD

## 2023-05-07 NOTE — Patient Instructions (Signed)
[facility] [place]  [address] on the 1st floor Phone number [phone]  

## 2023-05-08 ENCOUNTER — Encounter: Payer: Self-pay | Admitting: Family Medicine

## 2023-05-08 NOTE — Progress Notes (Signed)
 Labs look like a little allergy and possibly virus All else ok

## 2023-05-14 DIAGNOSIS — M9901 Segmental and somatic dysfunction of cervical region: Secondary | ICD-10-CM | POA: Diagnosis not present

## 2023-05-14 DIAGNOSIS — M9903 Segmental and somatic dysfunction of lumbar region: Secondary | ICD-10-CM | POA: Diagnosis not present

## 2023-05-14 DIAGNOSIS — M9902 Segmental and somatic dysfunction of thoracic region: Secondary | ICD-10-CM | POA: Diagnosis not present

## 2023-05-14 DIAGNOSIS — M9904 Segmental and somatic dysfunction of sacral region: Secondary | ICD-10-CM | POA: Diagnosis not present

## 2023-05-18 ENCOUNTER — Encounter: Payer: Self-pay | Admitting: Endocrinology

## 2023-05-18 ENCOUNTER — Encounter: Payer: Self-pay | Admitting: Family Medicine

## 2023-05-19 NOTE — Telephone Encounter (Signed)
Testosterone injection would be more likely to cause side effects, including elevated blood pressure.

## 2023-05-19 NOTE — Telephone Encounter (Signed)
Generally testosterone supplement in any form, can potentially cause elevated blood pressure if testosterone level is higher than normal.  In your case testosterone level has been in the normal range as of August 2024 on [place].  It is possible that it may be just a coincidence that you have started to have high blood pressure while being on Jatenzo.  You had been on topical AndroGel testosterone prior to that.  We can recheck your testosterone level as scheduled in coming of March, and to see if the level is elevated to cause hypertension.  Can also consider to change back to the topical AndroGel and stop Jatenzo.  This can be discussed in coming follow-up visit in March.  Please let me know if have any question.  Iraq Armida Vickroy, MD [facility] [address] [place], [address] [place], [place] [address] Phone # [phone]

## 2023-05-27 ENCOUNTER — Other Ambulatory Visit: Payer: Self-pay

## 2023-05-27 DIAGNOSIS — E2740 Unspecified adrenocortical insufficiency: Secondary | ICD-10-CM

## 2023-05-27 DIAGNOSIS — E230 Hypopituitarism: Secondary | ICD-10-CM

## 2023-05-31 ENCOUNTER — Other Ambulatory Visit: Payer: BC Managed Care – PPO

## 2023-05-31 DIAGNOSIS — E230 Hypopituitarism: Secondary | ICD-10-CM | POA: Diagnosis not present

## 2023-05-31 DIAGNOSIS — E2740 Unspecified adrenocortical insufficiency: Secondary | ICD-10-CM | POA: Diagnosis not present

## 2023-06-01 NOTE — Progress Notes (Unsigned)
 I am waiting on result of testosterone and then will decide.

## 2023-06-02 ENCOUNTER — Encounter: Payer: Self-pay | Admitting: Endocrinology

## 2023-06-04 LAB — BASIC METABOLIC PANEL
BUN: 20 mg/dL (ref 7–25)
CO2: 30 mmol/L (ref 20–32)
Calcium: 10.2 mg/dL (ref 8.6–10.3)
Chloride: 100 mmol/L (ref 98–110)
Creat: 1.1 mg/dL (ref 0.70–1.30)
Glucose, Bld: 90 mg/dL (ref 65–99)
Potassium: 5.1 mmol/L (ref 3.5–5.3)
Sodium: 138 mmol/L (ref 135–146)

## 2023-06-04 LAB — T4, FREE: Free T4: 1.2 ng/dL (ref 0.8–1.8)

## 2023-06-04 LAB — TSH: TSH: 1.27 m[IU]/L (ref 0.40–4.50)

## 2023-06-04 LAB — TESTOSTERONE, TOTAL, LC/MS/MS: Testosterone, Total, LC-MS-MS: 413 ng/dL (ref 250–1100)

## 2023-06-07 ENCOUNTER — Telehealth: Payer: Self-pay

## 2023-06-07 NOTE — Telephone Encounter (Signed)
 Patient given results and medication changes as directed by MD. No further questions at this time. Patient transferred to front desk to place lab appointment.

## 2023-06-07 NOTE — Telephone Encounter (Signed)
-----   Message from Iraq Thapa sent at 06/07/2023  8:16 AM EDT ----- Labs reviewed normal total testosterone.  No change on the medication doses.  Okay to reschedule visit this time to around August/September 2025, which will be 1 year from the last visit.  Check lab only prior to visit total testosterone, BMP.  Also please arrange for lab visit 1 week prior to the office visit.

## 2023-06-10 ENCOUNTER — Encounter: Payer: Self-pay | Admitting: Cardiovascular Disease

## 2023-06-10 DIAGNOSIS — I10 Essential (primary) hypertension: Secondary | ICD-10-CM

## 2023-06-11 MED ORDER — LOSARTAN POTASSIUM-HCTZ 50-12.5 MG PO TABS: 1.0000 | ORAL_TABLET | Freq: Every day | ORAL | 3 refills | Status: DC

## 2023-06-14 ENCOUNTER — Ambulatory Visit: Payer: BC Managed Care – PPO | Admitting: Endocrinology

## 2023-07-02 DIAGNOSIS — I10 Essential (primary) hypertension: Secondary | ICD-10-CM | POA: Diagnosis not present

## 2023-07-02 LAB — BASIC METABOLIC PANEL WITH GFR
BUN/Creatinine Ratio: 19 (ref 9–20)
BUN: 23 mg/dL (ref 6–24)
CO2: 24 mmol/L (ref 20–29)
Calcium: 9.7 mg/dL (ref 8.7–10.2)
Chloride: 102 mmol/L (ref 96–106)
Creatinine, Ser: 1.19 mg/dL (ref 0.76–1.27)
Glucose: 85 mg/dL (ref 70–99)
Potassium: 4.3 mmol/L (ref 3.5–5.2)
Sodium: 140 mmol/L (ref 134–144)
eGFR: 71 mL/min/{1.73_m2} (ref >59)

## 2023-07-02 LAB — PRO B NATRIURETIC PEPTIDE: NT-Pro BNP: <36 pg/mL (ref 0–210)

## 2023-07-28 ENCOUNTER — Encounter: Payer: Self-pay | Admitting: Family Medicine

## 2023-09-21 ENCOUNTER — Other Ambulatory Visit: Payer: Self-pay | Admitting: Endocrinology

## 2023-09-21 ENCOUNTER — Telehealth: Payer: Self-pay

## 2023-09-21 ENCOUNTER — Encounter: Payer: Self-pay | Admitting: Endocrinology

## 2023-09-21 NOTE — Telephone Encounter (Signed)
 Patient called and made aware received request for refill of Jatenzo . Awaiting MD to fill.

## 2023-09-21 NOTE — Telephone Encounter (Signed)
New prescription signed

## 2023-10-18 ENCOUNTER — Other Ambulatory Visit: Payer: Self-pay | Admitting: Family Medicine

## 2023-10-28 ENCOUNTER — Encounter: Payer: Self-pay | Admitting: Family Medicine

## 2023-11-04 DIAGNOSIS — M25531 Pain in right wrist: Secondary | ICD-10-CM | POA: Diagnosis not present

## 2023-11-04 DIAGNOSIS — M25532 Pain in left wrist: Secondary | ICD-10-CM | POA: Diagnosis not present

## 2023-12-02 DIAGNOSIS — M25531 Pain in right wrist: Secondary | ICD-10-CM | POA: Diagnosis not present

## 2023-12-05 ENCOUNTER — Encounter: Payer: Self-pay | Admitting: Endocrinology

## 2023-12-05 ENCOUNTER — Encounter: Payer: Self-pay | Admitting: Family Medicine

## 2023-12-05 DIAGNOSIS — E230 Hypopituitarism: Secondary | ICD-10-CM

## 2023-12-05 DIAGNOSIS — E2740 Unspecified adrenocortical insufficiency: Secondary | ICD-10-CM

## 2023-12-05 DIAGNOSIS — Z125 Encounter for screening for malignant neoplasm of prostate: Secondary | ICD-10-CM

## 2023-12-05 DIAGNOSIS — Z5181 Encounter for therapeutic drug level monitoring: Secondary | ICD-10-CM

## 2023-12-06 ENCOUNTER — Telehealth: Payer: Self-pay

## 2023-12-06 NOTE — Telephone Encounter (Signed)
 I have placed orders.  He also needs follow-up with me please schedule as per his availability.

## 2023-12-06 NOTE — Telephone Encounter (Signed)
 Patient called to schedule appt however patient unable due to currently in a meeting. Patient stated he would call office and schedule f/u as advised by mD.

## 2023-12-08 ENCOUNTER — Other Ambulatory Visit

## 2023-12-08 DIAGNOSIS — E2740 Unspecified adrenocortical insufficiency: Secondary | ICD-10-CM | POA: Diagnosis not present

## 2023-12-08 DIAGNOSIS — Z5181 Encounter for therapeutic drug level monitoring: Secondary | ICD-10-CM | POA: Diagnosis not present

## 2023-12-08 DIAGNOSIS — Z125 Encounter for screening for malignant neoplasm of prostate: Secondary | ICD-10-CM | POA: Diagnosis not present

## 2023-12-08 DIAGNOSIS — E230 Hypopituitarism: Secondary | ICD-10-CM | POA: Diagnosis not present

## 2023-12-09 DIAGNOSIS — M25531 Pain in right wrist: Secondary | ICD-10-CM | POA: Diagnosis not present

## 2023-12-09 DIAGNOSIS — M25532 Pain in left wrist: Secondary | ICD-10-CM | POA: Diagnosis not present

## 2023-12-12 LAB — BASIC METABOLIC PANEL WITH GFR
BUN: 20 mg/dL (ref 7–25)
CO2: 30 mmol/L (ref 20–32)
Calcium: 9.9 mg/dL (ref 8.6–10.3)
Chloride: 103 mmol/L (ref 98–110)
Creat: 0.9 mg/dL (ref 0.70–1.30)
Glucose, Bld: 91 mg/dL (ref 65–99)
Potassium: 4.3 mmol/L (ref 3.5–5.3)
Sodium: 140 mmol/L (ref 135–146)
eGFR: 99 mL/min/1.73m2 (ref >=60)

## 2023-12-12 LAB — TESTOSTERONE, TOTAL, LC/MS/MS: Testosterone, Total, LC-MS-MS: 423 ng/dL (ref 250–1100)

## 2023-12-12 LAB — PSA: PSA: 1.21 ng/mL (ref <=4.00)

## 2023-12-14 ENCOUNTER — Ambulatory Visit: Payer: Self-pay | Admitting: Endocrinology

## 2023-12-20 ENCOUNTER — Other Ambulatory Visit: Payer: Self-pay | Admitting: Endocrinology

## 2024-01-03 ENCOUNTER — Encounter: Payer: Self-pay | Admitting: Endocrinology

## 2024-01-03 ENCOUNTER — Other Ambulatory Visit

## 2024-01-03 ENCOUNTER — Ambulatory Visit (INDEPENDENT_AMBULATORY_CARE_PROVIDER_SITE_OTHER): Admitting: Endocrinology

## 2024-01-03 VITALS — BP 126/62 | HR 56 | Resp 16 | Ht 70.0 in | Wt 191.0 lb

## 2024-01-03 DIAGNOSIS — E230 Hypopituitarism: Secondary | ICD-10-CM

## 2024-01-03 DIAGNOSIS — E2740 Unspecified adrenocortical insufficiency: Secondary | ICD-10-CM

## 2024-01-03 DIAGNOSIS — Z5181 Encounter for therapeutic drug level monitoring: Secondary | ICD-10-CM

## 2024-01-03 DIAGNOSIS — Z125 Encounter for screening for malignant neoplasm of prostate: Secondary | ICD-10-CM | POA: Diagnosis not present

## 2024-01-03 MED ORDER — HYDROCORTISONE 10 MG PO TABS: ORAL_TABLET | ORAL | 4 refills | Status: AC

## 2024-01-03 MED ORDER — JATENZO 198 MG PO CAPS: ORAL_CAPSULE | ORAL | 5 refills | Status: DC

## 2024-01-03 NOTE — Progress Notes (Signed)
 Outpatient Endocrinology Note Brandon Vaness Jelinski, MD  01/03/24  Patient's Name: Brandon Andrews    DOB: [birth year]    MRN: [id number]  REASON OF VISIT: Follow up for adrenal insufficiency/hypogonadism.  PCP: Wendolyn Jenkins Jansky, MD  HISTORY OF PRESENT ILLNESS:   Brandon Andrews is a 58 y.o. old male with past medical history listed below, is here for follow up  of adrenal insufficiency/hypogonadism.  Patient was last seen by Dr. Von in May 2024.  Pertinent History:  #Adrenal insufficiency : From June 2021 patient had symptoms of headache, weakness, weight loss, joint pain.  In October 2021 patient was evaluated by rheumatology and given prednisone 10 mg twice daily which he took for 3 weeks and improved his energy level, mood and weakness.  He was taken off prednisone in early December 2021.  He was initially evaluated in endocrinology clinic due to low undetectable cortisol in December 2021.  He had symptoms of weakness, weight loss and decreased appetite with low blood pressure subsequently had a lab with undetectable cortisol level even after ACTH  stimulation test and had ACTH  level undetectable.  With a diagnosis of adrenal insufficiency patient was started on hydrocortisone  20 mg in the morning and 10 mg in the afternoon on March 29, 2020.   Latest Reference Range & Units 03/27/20 14:51 03/27/20 15:53 03/28/20 13:48  ACTH  7.2 - 63.3 pg/mL   <1.5 (L)  Cortisol, Plasma ug/dL 0.0 0.0   (L): Data is abnormally low   # Hypothyroidism Patient reportedly had testosterone  deficiency 14 to 15 + years, previously was treated with AndroGel , he only has symptoms of fatigue since late 2021.  Most of the testosterone  levels had been over 300 with PCP office in the past.  With the laboratory evaluation of hypogonadism had 3 TSH level at baseline was significantly low with normal LH and has been on AndroGel  1.62%.  Prolactin level at baseline was increased to 26 and subsequently back to normal.  In  August 2021 he had mildly increasing TSH of 5.05 and was started on levothyroxine 25 mcg without any benefit, subsequently free T4 has been normal without levothyroxine supplement.  IGF-I normal in 2021.  MRI pituitary in January 2022 showed normal pituitary gland.  During initial endocrinology evaluation patient was diagnosed with partial hypopituitarism related to likely episode of ? hypophysitis in August 2021 etiology unclear.  He has hypogonadism and adrenal insufficiency.  He has been on glucocorticoid replacement with hydrocortisone  and testosterone  replacement.  Used to be on topical testosterone  AndroGel  and changed to oral testosterone  Jatenzo  in 07/2022.    Interval history  Patient has been taking hydrocortisone  20 mg in the morning and 10 mg in the afternoon.  Overall feeling good, no dizziness or lightheadedness.  He has found unable to lose weight despite swimming biking and regular exercise/walking.  Laboratory results were acceptable with PSA, electrolytes, testosterone  levels of 423 completed in the afternoon.  He had normal thyroid  function test in March.  He has been taking Jatenzo  198 mg 2 times a day.  No other complaints today.   REVIEW OF SYSTEMS:  As per history of present illness.   PAST MEDICAL HISTORY: Past Medical History:  Diagnosis Date   Adrenal failure    Arthritis    Chest pain    Depression    Hemangioma of liver    Pituitary deficiency     PAST SURGICAL HISTORY: Past Surgical History:  Procedure Laterality Date   JOINT REPLACEMENT Bilateral 2020  Bilateral hip replacement   SHOULDER SURGERY Bilateral    right, 1985, 2006, left, 2008   VASECTOMY     WRIST SURGERY Left 1991    ALLERGIES: No Known Allergies  FAMILY HISTORY:  Family History  Problem Relation Age of Onset   Hyperlipidemia Father    Heart disease Father    Hearing loss Father    Hypertension Father    Heart attack Father    Thyroid  cancer Sister    Diabetes Paternal  Grandmother     SOCIAL HISTORY: Social History   Socioeconomic History   Marital status: Married    Spouse name: Not on file   Number of children: 1   Years of education: Not on file   Highest education level: Not on file  Occupational History   Not on file  Tobacco Use   Smoking status: Never   Smokeless tobacco: Never  Substance and Sexual Activity   Alcohol use: Never   Drug use: Never   Sexual activity: Not on file  Other Topics Concern   Not on file  Social History Narrative   IT-ops mgr   Social Drivers of Health   Financial Resource Strain: Not on file  Food Insecurity: Not on file  Transportation Needs: Not on file  Physical Activity: Not on file  Stress: Not on file  Social Connections: Not on file    MEDICATIONS:  Current Outpatient Medications  Medication Sig Dispense Refill   buPROPion  (WELLBUTRIN  XL) 150 MG 24 hr tablet Take 1 tablet (150 mg total) by mouth daily. 90 tablet 1   cyanocobalamin (VITAMIN B12) 500 MCG tablet Take 1 tablet by mouth 2 (two) times daily.     hydrocortisone  (CORTEF ) 10 MG tablet MAY take double doses FOR stress situations 300 tablet 4   JATENZO  198 MG CAPS 1 capsule twice a day with food 60 capsule 5   No current facility-administered medications for this visit.    PHYSICAL EXAM: Vitals:   01/03/24 0808  BP: 126/62  Pulse: (!) 56  Resp: 16  SpO2: 98%  Weight: 191 lb (86.6 kg)  Height: 5' 10 (1.778 m)   Body mass index is 27.41 kg/m.   General: Well developed, well nourished male in no apparent distress. HEENT: AT/[place], no external lesions. Hearing intact to the spoken word Eyes:  Conjunctiva clear and no icterus.  Neurologic: Alert, oriented, normal speech Extremities: No pedal pitting edema Skin: color good.  Psychiatric: Does not appear depressed or anxious  PERTINENT HISTORIC LABORATORY AND IMAGING STUDIES:  All pertinent laboratory results were reviewed. Please see HPI also for further details.    ASSESSMENT / PLAN  1. Hypogonadotropic hypogonadism   2. Adrenal insufficiency   3. Encounter for medication monitoring   4. Prostate cancer screening   5. Hypopituitarism    Patient has possible hypopituitarism with adrenal insufficiency and hypogonadotropic hypogonadism, he was diagnosed in December 2021 and has been on glucocorticoid and testosterone  supplement.  No history of other greater hormone deficiencies.  Adrenal insufficiency: -Continue hydrocortisone  20 mg in the morning and 10 mg in the afternoon.  Discussed about taking additional doses of hydrocortisone  2-3 times the usual days in case of illness. -Discussed about trial of tapering hydrocortisone  okay to lower by 5 mg every month, until taking hydrocortisone  15 mg in the morning and 5 mg in the afternoon.  Discussed about potential symptoms related to lowering hydrocortisone . -Asked to wear medical alert of adrenal insufficiency bracelet or necklace.  Hypogonadism -Recent total  testosterone  normal 423 in the afternoon. -Continue current dose of Jatenzo  198 mg 2 times a day.  Labs prior to follow-up visit as ordered.  Annual endocrinology follow-up.  Diagnoses and all orders for this visit:  Hypogonadotropic hypogonadism -     Testosterone , Total, LC/MS/MS -     JATENZO  198 MG CAPS; 1 capsule twice a day with food  Adrenal insufficiency -     Basic metabolic panel with GFR -     hydrocortisone  (CORTEF ) 10 MG tablet; MAY take double doses FOR stress situations  Encounter for medication monitoring -     AST -     PSA -     Hematocrit  Prostate cancer screening -     PSA  Hypopituitarism -     T4, free -     TSH   DISPOSITION Follow up in clinic in 12 months suggested.  All questions answered and patient verbalized understanding of the plan.  Brandon Larrisa Cravey, MD [facility] [address] [place], [address] [place], [place] [address] Phone # [phone]  At least part of  this note was generated using voice recognition software. Inadvertent word errors may have occurred, which were not recognized during the proofreading process.

## 2024-01-04 ENCOUNTER — Encounter: Payer: BC Managed Care – PPO | Admitting: Family Medicine

## 2024-01-14 ENCOUNTER — Encounter: Admitting: Family Medicine

## 2024-01-18 ENCOUNTER — Encounter: Payer: Self-pay | Admitting: Family Medicine

## 2024-01-18 ENCOUNTER — Ambulatory Visit: Admitting: Family Medicine

## 2024-01-18 ENCOUNTER — Ambulatory Visit: Payer: Self-pay | Admitting: Family Medicine

## 2024-01-18 VITALS — BP 130/78 | HR 52 | Temp 97.8°F | Ht 70.0 in | Wt 191.0 lb

## 2024-01-18 DIAGNOSIS — E875 Hyperkalemia: Secondary | ICD-10-CM

## 2024-01-18 DIAGNOSIS — Z23 Encounter for immunization: Secondary | ICD-10-CM

## 2024-01-18 DIAGNOSIS — Z131 Encounter for screening for diabetes mellitus: Secondary | ICD-10-CM | POA: Diagnosis not present

## 2024-01-18 DIAGNOSIS — Z13220 Encounter for screening for lipoid disorders: Secondary | ICD-10-CM | POA: Diagnosis not present

## 2024-01-18 DIAGNOSIS — Z0000 Encounter for general adult medical examination without abnormal findings: Secondary | ICD-10-CM | POA: Diagnosis not present

## 2024-01-18 LAB — LIPID PANEL
Cholesterol: 173 mg/dL (ref 0–200)
HDL: 48.7 mg/dL (ref >39.00)
LDL Cholesterol: 105 mg/dL — ABNORMAL HIGH (ref 0–99)
NonHDL: 123.9
Total CHOL/HDL Ratio: 4
Triglycerides: 94 mg/dL (ref 0.0–149.0)
VLDL: 18.8 mg/dL (ref 0.0–40.0)

## 2024-01-18 LAB — CBC WITH DIFFERENTIAL/PLATELET
Basophils Absolute: 0 K/uL (ref 0.0–0.1)
Basophils Relative: 0.5 % (ref 0.0–3.0)
Eosinophils Absolute: 0.3 K/uL (ref 0.0–0.7)
Eosinophils Relative: 3.3 % (ref 0.0–5.0)
HCT: 46.8 % (ref 39.0–52.0)
Hemoglobin: 16 g/dL (ref 13.0–17.0)
Lymphocytes Relative: 37.5 % (ref 12.0–46.0)
Lymphs Abs: 3.7 K/uL (ref 0.7–4.0)
MCHC: 34.1 g/dL (ref 30.0–36.0)
MCV: 90.8 fl (ref 78.0–100.0)
Monocytes Absolute: 0.7 K/uL (ref 0.1–1.0)
Monocytes Relative: 7.3 % (ref 3.0–12.0)
Neutro Abs: 5 K/uL (ref 1.4–7.7)
Neutrophils Relative %: 51.4 % (ref 43.0–77.0)
Platelets: 323 K/uL (ref 150.0–400.0)
RBC: 5.15 Mil/uL (ref 4.22–5.81)
RDW: 13.5 % (ref 11.5–15.5)
WBC: 9.8 K/uL (ref 4.0–10.5)

## 2024-01-18 LAB — COMPREHENSIVE METABOLIC PANEL WITH GFR
ALT: 22 U/L (ref 0–53)
AST: 20 U/L (ref 0–37)
Albumin: 4.5 g/dL (ref 3.5–5.2)
Alkaline Phosphatase: 45 U/L (ref 39–117)
BUN: 19 mg/dL (ref 6–23)
CO2: 32 meq/L (ref 19–32)
Calcium: 9.8 mg/dL (ref 8.4–10.5)
Chloride: 103 meq/L (ref 96–112)
Creatinine, Ser: 1.09 mg/dL (ref 0.40–1.50)
GFR: 74.75 mL/min (ref >60.00)
Glucose, Bld: 80 mg/dL (ref 70–99)
Potassium: 5.3 meq/L — ABNORMAL HIGH (ref 3.5–5.1)
Sodium: 143 meq/L (ref 135–145)
Total Bilirubin: 0.6 mg/dL (ref 0.2–1.2)
Total Protein: 6.6 g/dL (ref 6.0–8.3)

## 2024-01-18 LAB — TSH: TSH: 3.96 u[IU]/mL (ref 0.35–5.50)

## 2024-01-18 LAB — HEMOGLOBIN A1C: Hgb A1c MFr Bld: 5.6 % (ref 4.6–6.5)

## 2024-01-18 MED ORDER — BUPROPION HCL ER (XL) 300 MG PO TB24: 300.0000 mg | ORAL_TABLET | Freq: Every day | ORAL | 3 refills | Status: AC

## 2024-01-18 NOTE — Progress Notes (Signed)
 Phone: [phone]   Subjective:  Patient 58 y.o. male presenting for annual physical.  Chief Complaint  Patient presents with   Annual Exam    Physical;     Discussed the use of AI scribe software for clinical note transcription with the patient, who gave verbal consent to proceed.  History of Present Illness Brandon Andrews is a 58 year old male who presents for an annual physical exam.  He has adrenal failure related to pituitary issues and is under the care of an endocrinologist. He continues to take hydrocortisone  and testosterone . He is unsure about the current status of his testosterone  levels but believes they are improved.  He has been taking Wellbutrin  150mg  for depression for a long time and feels it is not as effective as it used to be. He feels a little depressed, fatigued, unmotivated, and blah. No suicidal thoughts.  He exercises regularly and reports no major migraines, headaches, dizziness, syncope, blurry vision, rhinorrhea, congestion, dysphagia, chest pain, palpitations, coughing, wheezing, dyspnea, vomiting, diarrhea, constipation, or dysuria. No erectile dysfunction. He reports no muscle aches or joint pains outside of workouts. He mentions some shoulder discomfort but feels relatively physically healthy.  He has received tetanus, flu, and hepatitis B vaccinations. He is unsure if he has received the hepatitis B vaccine in the past, possibly during his [employer].  He has not visited the [place] recently but goes once a year to maintain his records. He has not had recent liver function tests but had kidney function, prostate, and testosterone  tests done at his last visit. He is due for a colonoscopy in two years and is fasting today in preparation for blood work.    See problem oriented charting- ROS- ROS: Gen: no fever, chills  Skin: no rash, itching ENT: no ear pain, ear drainage, nasal congestion, rhinorrhea, sinus pressure, sore throat Eyes: no  blurry vision, double vision Resp: no cough, wheeze,SOB CV: no CP, palpitations, LE edema,  GI: no heartburn, n/v/d/c, abd pain GU: no dysuria, urgency, frequency, hematuria MSK: no joint pain, myalgias, back pain Neuro: no dizziness, headache, weakness, vertigo Psych: HPI  The following were reviewed and entered/updated in epic: Past Medical History:  Diagnosis Date   Adrenal failure    Arthritis    Chest pain    Depression    Hemangioma of liver    Pituitary deficiency    Patient Active Problem List   Diagnosis Date Noted   Depression, recurrent 01/02/2023   Hypopituitarism 04/15/2020   Past Surgical History:  Procedure Laterality Date   JOINT REPLACEMENT Bilateral 2020   Bilateral hip replacement   SHOULDER SURGERY Bilateral    right, 1985, 2006, left, 2008   VASECTOMY     WRIST SURGERY Left 1991    Family History  Problem Relation Age of Onset   Hyperlipidemia Father    Heart disease Father    Hearing loss Father    Hypertension Father    Heart attack Father    Thyroid  cancer Sister    Diabetes Paternal Grandmother     Medications- reviewed and updated Current Outpatient Medications  Medication Sig Dispense Refill   cyanocobalamin (VITAMIN B12) 500 MCG tablet Take 1 tablet by mouth 2 (two) times daily.     hydrocortisone  (CORTEF ) 10 MG tablet MAY take double doses FOR stress situations 300 tablet 4   JATENZO  198 MG CAPS 1 capsule twice a day with food 60 capsule 5   buPROPion  (WELLBUTRIN  XL) 300 MG 24 hr  tablet Take 1 tablet (300 mg total) by mouth daily. 90 tablet 3   No current facility-administered medications for this visit.    Allergies-reviewed and updated No Known Allergies  Social History   Social History Narrative   IT-ops mgr   Objective  Objective:  BP 130/78   Pulse (!) 52   Temp 97.8 F (36.6 C)   Ht 5' 10 (1.778 m)   Wt 191 lb (86.6 kg)   SpO2 97%   BMI 27.41 kg/m  Physical Exam  Gen: WDWN NAD HEENT: NCAT, conjunctiva not  injected, sclera nonicteric TM WNL B, OP moist, no exudates  NECK:  supple, no thyromegaly, no nodes, no carotid bruits CARDIAC: RRR, S1S2+, no murmur. DP 2+B LUNGS: CTAB. No wheezes ABDOMEN:  BS+, soft, NTND, No HSM, no masses EXT:  no edema MSK: no gross abnormalities. MS 5/5 all 4 NEURO: A&O x3.  CN II-XII intact.  PSYCH: normal mood. Good eye contact     Assessment and Plan   Health Maintenance counseling: 1. Anticipatory guidance: Patient counseled regarding regular dental exams q6 months, eye exams yearly, avoiding smoking and second hand smoke, limiting alcohol to 2 beverages per day.   2. Risk factor reduction:  Advised patient of need for regular exercise and diet rich in fruits and vegetables to reduce risk of heart attack and stroke. Exercise- +.   Wt Readings from Last 3 Encounters:  01/18/24 191 lb (86.6 kg)  01/03/24 191 lb (86.6 kg)  05/07/23 193 lb (87.5 kg)   3. Immunizations/screenings/ancillary studies Immunization History  Administered Date(s) Administered   Hepb-cpg 01/18/2024   Influenza Inj Mdck Quad Pf 12/14/2018   Influenza Split 02/14/2021   Influenza, Seasonal, Injecte, Preservative Fre 12/08/2021, 01/01/2023, 01/18/2024   Influenza,inj,Quad PF,6+ Mos 02/19/2018, 12/08/2021   Influenza,inj,quad, With Preservative 02/19/2018, 12/14/2018   Influenza-Unspecified 12/28/2021   Moderna Sars-Covid-2 Vaccination 06/14/2019, 07/12/2019   Pfizer(Comirnaty)Fall Seasonal Vaccine 12 years and older 02/17/2023   Td 12/21/2013   Tdap 08/16/2015   Zoster Recombinant(Shingrix) 01/09/2019, 03/14/2019   There are no preventive care reminders to display for this patient.   4. Prostate cancer screening >55yo - risk factors?  Lab Results  Component Value Date   PSA 1.21 12/08/2023   PSA 1.10 12/09/2022   PSA 0.63 12/08/2021    5. Colon cancer screening:due 02/05/26 6. Skin cancer screening- Iadvised regular sunscreen use. Denies worrisome, changing, or new skin  lesions.  7. Smoking associated screening (lung cancer screening, AAA screen 65-75, UA)- non smoker-  Wellness examination -     Lipid panel -     Comprehensive metabolic panel with GFR -     CBC with Differential/Platelet -     Hemoglobin A1c -     TSH  Need for hepatitis vaccination -     Heplisav-B (HepB-CPG) Vaccine  Flu vaccine need -     Flu vaccine trivalent PF, 6mos and older(Flulaval,Afluria,Fluarix,Fluzone)  Other orders -     buPROPion  HCl ER (XL); Take 1 tablet (300 mg total) by mouth daily.  Dispense: 90 tablet; Refill: 3   Wellness-anticipatory guidance.  Work on Diet/Exercise  Check CBC,CMP,lipids,TSH, A1C.  F/u 1 yr   Assessment and Plan Assessment & Plan Adult Wellness Visit   He attended a routine adult wellness visit with no new surgeries or changes in family history. He abstains from smoking, alcohol, and vaping, and engages in regular exercise. He has no major complaints of headaches, dizziness, or systemic symptoms, though he reports muscle  aches related to workouts. There are no issues with erectile dysfunction, concerning moles, or rashes. Continue regular exercise and maintain a healthy lifestyle. Perform routine blood work excluding PSA and schedule a colonoscopy in two years.  Depression   Chronic depression is managed with Wellbutrin . He reports symptoms of depression, fatigue, and lack of motivation, but no suicidal ideation. Due to decreased efficacy at the current dose, consider increasing Wellbutrin  to 300 mg, though he prefers not to increase the dosage if no benefit is observed. Increase Wellbutrin  to 300 mg, monitor for side effects, and adjust the dose as needed. Send the prescription to [facility].  Adrenal insufficiency   Chronic adrenal insufficiency is managed with hydrocortisone , with no acute issues reported. Continue hydrocortisone  as prescribed.  Testosterone  deficiency   Testosterone  levels are well-managed based on recent labs, with  no issues reported. Continue current testosterone  therapy.  Immunization management   He is up to date on tetanus and flu vaccinations. Discussed the new pneumonia vaccine recommendation for those aged 103 and older, and the RSV vaccine due to increased risk in those with adrenal insufficiency and age 67. He is uncertain about his hepatitis B vaccination status from [employer] service and was advised to check military records. Schedule the pneumonia and RSV vaccines in two weeks. Check military records for hepatitis B vaccination status. If not vaccinated, plan the hepatitis B series: first dose in two months, second dose in six months.     Recommended follow up: Return in about 1 year (around 01/17/2025) for annual physical.  Lab/Order associations:+ fasting   Jenkins CHRISTELLA Carrel, MD

## 2024-01-18 NOTE — Patient Instructions (Addendum)
 It was very nice to see you today!  2 weeks pneumonia and rsv.  2 months hep B-but check military records   PLEASE NOTE:  If you had any lab tests please let us  know if you have not heard back within a few days. You may see your results on MyChart before we have a chance to review them but we will give you a call once they are reviewed by us . If we ordered any referrals today, please let us  know if you have not heard from their office within the next week.   Please try these tips to maintain a healthy lifestyle:  Eat most of your calories during the day when you are active. Eliminate processed foods including packaged sweets (pies, cakes, cookies), reduce intake of potatoes, white bread, white pasta, and white rice. Look for whole grain options, oat flour or almond flour.  Each meal should contain half fruits/vegetables, one quarter protein, and one quarter carbs (no bigger than a computer mouse).  Cut down on sweet beverages. This includes juice, soda, and sweet tea. Also watch fruit intake, though this is a healthier sweet option, it still contains natural sugar! Limit to 3 servings daily.  Drink at least 1 glass of water with each meal and aim for at least 8 glasses per day  Exercise at least 150 minutes every week.

## 2024-01-18 NOTE — Progress Notes (Signed)
 Labs great.  Suspect potassium from lab draw.  Make sure not taking a lot of potassium.  Recheck potassium in 2 weeks to make sure not a problem

## 2024-01-19 ENCOUNTER — Encounter: Payer: Self-pay | Admitting: Endocrinology

## 2024-01-19 ENCOUNTER — Telehealth: Payer: Self-pay

## 2024-01-19 ENCOUNTER — Other Ambulatory Visit: Payer: Self-pay

## 2024-01-19 NOTE — Telephone Encounter (Signed)
 Pharmacy called needing clarification on RX Cortef .

## 2024-01-19 NOTE — Telephone Encounter (Signed)
 If you are referring to hydrocortisone .  Currently taking hydrocortisone  20 mg in the morning and 10 mg in the afternoon.  Can decrease as follows  Take hydrocortisone  15 mg in the morning and 10 mg in the afternoon for a month.  Then after a month, decrease to hydrocortisone  15 mg in the morning and 5 mg in the afternoon.  Iraq Marq Rebello, MD [facility] [address] [place], [address] [place], [place] [address] Phone # [phone]

## 2024-01-19 NOTE — Telephone Encounter (Signed)
 Please refer to the other message forwarded today regarding dose of regular hydrocortisone .

## 2024-01-19 NOTE — Telephone Encounter (Signed)
 Take double is as needed for a stressful situation /illness for 3 to 5 days as instructed.

## 2024-02-08 DIAGNOSIS — M25511 Pain in right shoulder: Secondary | ICD-10-CM | POA: Diagnosis not present

## 2024-02-08 DIAGNOSIS — M25512 Pain in left shoulder: Secondary | ICD-10-CM | POA: Diagnosis not present

## 2024-02-10 ENCOUNTER — Other Ambulatory Visit (INDEPENDENT_AMBULATORY_CARE_PROVIDER_SITE_OTHER)

## 2024-02-10 DIAGNOSIS — E875 Hyperkalemia: Secondary | ICD-10-CM | POA: Diagnosis not present

## 2024-02-11 LAB — COMPREHENSIVE METABOLIC PANEL WITH GFR
ALT: 24 U/L (ref 0–53)
AST: 23 U/L (ref 0–37)
Albumin: 4.5 g/dL (ref 3.5–5.2)
Alkaline Phosphatase: 46 U/L (ref 39–117)
BUN: 21 mg/dL (ref 6–23)
CO2: 31 meq/L (ref 19–32)
Calcium: 9.5 mg/dL (ref 8.4–10.5)
Chloride: 101 meq/L (ref 96–112)
Creatinine, Ser: 1.08 mg/dL (ref 0.40–1.50)
GFR: 75.54 mL/min (ref >60.00)
Glucose, Bld: 86 mg/dL (ref 70–99)
Potassium: 4.2 meq/L (ref 3.5–5.1)
Sodium: 138 meq/L (ref 135–145)
Total Bilirubin: 0.5 mg/dL (ref 0.2–1.2)
Total Protein: 6.8 g/dL (ref 6.0–8.3)

## 2024-02-13 ENCOUNTER — Ambulatory Visit: Payer: Self-pay | Admitting: Family Medicine

## 2024-02-13 NOTE — Progress Notes (Signed)
 Labs fine

## 2024-02-14 NOTE — Progress Notes (Signed)
 Called pt and notified smk

## 2024-02-28 ENCOUNTER — Encounter: Payer: Self-pay | Admitting: Family Medicine

## 2024-03-03 ENCOUNTER — Ambulatory Visit: Admitting: Family Medicine

## 2024-03-03 ENCOUNTER — Encounter: Payer: Self-pay | Admitting: Family Medicine

## 2024-03-03 VITALS — BP 128/80 | HR 73 | Temp 97.7°F | Ht 70.0 in | Wt 194.8 lb

## 2024-03-03 DIAGNOSIS — H9313 Tinnitus, bilateral: Secondary | ICD-10-CM | POA: Diagnosis not present

## 2024-03-03 DIAGNOSIS — F339 Major depressive disorder, recurrent, unspecified: Secondary | ICD-10-CM | POA: Diagnosis not present

## 2024-03-03 DIAGNOSIS — H9190 Unspecified hearing loss, unspecified ear: Secondary | ICD-10-CM

## 2024-03-03 NOTE — Progress Notes (Signed)
 Brandon Andrews is a 58 y.o. male who presents today for an office visit.  Assessment/Plan:  Tinnitus / Hearing Loss Patient with decreased hearing on in office hearing test otherwise normal exam today without any signs of eustachian tube dysfunction or otitis media.  Normal neurologic exam and no red flags.  He did recently increase the dose of Wellbutrin  to 150 to 300 mg daily which may have caused his recent onset tinnitus however likely does have some component of presbycusis which is playing a role as well.  We will refer him to audiology for more definitive hearing testing however he would like to decrease his Wellbutrin  back to 150 mg daily to see if this alleviates his symptoms.  He will follow-up with me or his PCP in a few weeks  Recurrent depression As above his PCP recently increased his Wellbutrin  to 300 mg daily.  He does not feel like this has been as beneficial for managing his symptoms and would like to go back to 150 mg daily due to the above concerns about tinnitus.  He does not need a new prescription for this.  He will follow-up with me or his PCP in a few weeks.    Subjective:  HPI:  See assessment / plan for status of chronic conditions.   Discussed the use of AI scribe software for clinical note transcription with the patient, who gave verbal consent to proceed.  History of Present Illness Brandon Andrews is a 58 year old male who presents with bilateral tinnitus.  He has been experiencing a high-pitched, constant ringing in both ears for the past five weeks. The tinnitus is persistent, with no variation, and is present all day, every day. He mentions a minor illness a couple of days before the onset of symptoms and reports that his medications have been pretty constant, though his Wellbutrin  (bupropion ) dose was increased about a month ago. He is typically cautious about ear protection, wearing earplugs while swimming and using ear protection during  activities that involve noise exposure.  No hearing loss, stating his hearing has always been good, and has not noticed any changes in his hearing ability despite the tinnitus. His current medications include Wellbutrin  (bupropion ), which was increased to 300 mg about a month ago, and he has been on this medication for approximately ten years. He also uses Tylenol and naproxen occasionally, primarily for shoulder pain.  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 He consistently uses ear protection during activities that involve noise exposure. No dizziness or other associated symptoms. He is frustrated with the tinnitus, particularly when it disrupts his sleep, stating it can take up to an hour to fall back asleep after waking at night.         Objective:  Physical Exam: BP 128/80   Pulse 73   Temp 97.7 F (36.5 C) (Temporal)   Ht 5' 10 (1.778 m)   Wt 194 lb 12.8 oz (88.4 kg)   SpO2 98%   BMI 27.95 kg/m   Gen: No acute distress, resting comfortably HEENT: TMs clear bilaterally. CV: Regular rate and rhythm with no murmurs appreciated Pulm: Normal work of breathing, clear to auscultation bilaterally with no crackles, wheezes, or rhonchi Neuro: Grossly normal, moves all extremities.  Rinee and Weber test normal. Failed in office hearing test bilaterally Psych: Normal affect and thought content  Worth HERO. Kennyth, MD 03/03/2024 1:11 PM

## 2024-03-09 DIAGNOSIS — M25512 Pain in left shoulder: Secondary | ICD-10-CM | POA: Diagnosis not present

## 2024-03-09 DIAGNOSIS — M25511 Pain in right shoulder: Secondary | ICD-10-CM | POA: Diagnosis not present

## 2024-03-10 ENCOUNTER — Encounter: Payer: Self-pay | Admitting: Family Medicine

## 2024-03-13 ENCOUNTER — Other Ambulatory Visit: Payer: Self-pay | Admitting: Family Medicine

## 2024-03-13 DIAGNOSIS — M25512 Pain in left shoulder: Secondary | ICD-10-CM | POA: Diagnosis not present

## 2024-03-13 DIAGNOSIS — M25511 Pain in right shoulder: Secondary | ICD-10-CM | POA: Diagnosis not present

## 2024-03-13 MED ORDER — OSELTAMIVIR PHOSPHATE 75 MG PO CAPS: 75.0000 mg | ORAL_CAPSULE | Freq: Every day | ORAL | 0 refills | Status: AC

## 2024-04-17 ENCOUNTER — Encounter: Admitting: Family Medicine

## 2024-04-24 ENCOUNTER — Other Ambulatory Visit (HOSPITAL_COMMUNITY): Payer: Self-pay

## 2024-04-24 ENCOUNTER — Encounter: Payer: Self-pay | Admitting: Endocrinology

## 2024-04-24 ENCOUNTER — Telehealth: Payer: Self-pay

## 2024-04-24 DIAGNOSIS — E230 Hypopituitarism: Secondary | ICD-10-CM

## 2024-04-24 NOTE — Telephone Encounter (Signed)
 ERROR

## 2024-04-24 NOTE — Telephone Encounter (Signed)
 Pharmacy Patient Advocate Encounter   Received notification from Pt Calls Messages that prior authorization for Jatenzo  198MG  capsules is required/requested.   Insurance verification completed.   The patient is insured through [employer] and [employer].   Per test claim: PA required and submitted KEY/EOC/Request #: [id number] from 04/24/2024 to 04/24/2025 RjdzPi:[id number]

## 2024-04-25 MED ORDER — JATENZO 198 MG PO CAPS: ORAL_CAPSULE | ORAL | 5 refills | Status: DC

## 2024-04-25 MED ORDER — JATENZO 198 MG PO CAPS: 198.0000 mg | ORAL_CAPSULE | Freq: Two times a day (BID) | ORAL | 4 refills | Status: AC

## 2024-04-25 NOTE — Telephone Encounter (Signed)
 Prescription sent

## 2025-01-23 ENCOUNTER — Encounter: Admitting: Family Medicine
